# Patient Record
Sex: Female | Born: 1990 | Race: Black or African American | Hispanic: No | Marital: Single | Smoking: Never smoker
Health system: Southern US, Community
[De-identification: ages and names within clinical notes are randomized; demographics above are authoritative.]

---

## 2021-07-15 ENCOUNTER — Telehealth: Payer: Self-pay

## 2021-07-15 NOTE — Telephone Encounter (Signed)
TC to patient who has new OB appointment tomorrow with no records, no PT available. Patient states she has no PT record, but has U/S. Patient counseled to bring that with her to appointment. Patient also asked height, pre-pregnant weight and LMP. Information entered into chart.Burt Knack, RN

## 2021-07-15 NOTE — Progress Notes (Signed)
TC to patient who has new OB tomorrow. Height, pre-pregnant weight, LMP abstracted. Patient counseled to bring proof of pregnancy to appointment and she states she will bring U/S.Marland KitchenBurt Knack, RN

## 2021-07-16 ENCOUNTER — Ambulatory Visit: Payer: Medicaid Other | Admitting: Advanced Practice Midwife

## 2021-07-16 ENCOUNTER — Encounter: Payer: Self-pay | Admitting: Advanced Practice Midwife

## 2021-07-16 ENCOUNTER — Other Ambulatory Visit: Payer: Self-pay

## 2021-07-16 DIAGNOSIS — O0930 Supervision of pregnancy with insufficient antenatal care, unspecified trimester: Secondary | ICD-10-CM | POA: Insufficient documentation

## 2021-07-16 DIAGNOSIS — O99310 Alcohol use complicating pregnancy, unspecified trimester: Secondary | ICD-10-CM | POA: Insufficient documentation

## 2021-07-16 DIAGNOSIS — Z3482 Encounter for supervision of other normal pregnancy, second trimester: Secondary | ICD-10-CM | POA: Insufficient documentation

## 2021-07-16 DIAGNOSIS — O99312 Alcohol use complicating pregnancy, second trimester: Secondary | ICD-10-CM

## 2021-07-16 DIAGNOSIS — O0932 Supervision of pregnancy with insufficient antenatal care, second trimester: Secondary | ICD-10-CM

## 2021-07-16 DIAGNOSIS — Z98891 History of uterine scar from previous surgery: Secondary | ICD-10-CM | POA: Insufficient documentation

## 2021-07-16 LAB — WET PREP FOR TRICH, YEAST, CLUE
Trichomonas Exam: NEGATIVE
Yeast Exam: NEGATIVE

## 2021-07-16 LAB — HEMOGLOBIN, FINGERSTICK: Hemoglobin: 12.6 g/dL (ref 11.1–15.9)

## 2021-07-16 NOTE — Progress Notes (Signed)
In house labs reviewed, no treatment indicated. Patient declines flu vaccine today. ROI faxed to [facility] with confirmation (for 09/2020 pap test results).Burt Knack, RN

## 2021-07-16 NOTE — Progress Notes (Signed)
[facility] HEALTH DEPT [facility] [address] [place] [address] [phone]  INITIAL PRENATAL VISIT NOTE  Subjective:  Maureen Harris is a 30 y.o. SBF exsmoker G3P1011 at [redacted]w[redacted]d being seen today to start prenatal care at the [facility] Department.  She feels "happy and nervous" about planned pregnancy with no birth control x 3 years.  30 yo FOB feels "he's happy" about pregnancy; in supportive almost  3 year relationship and this is his first child.  She moved here 07/14/21 from [place], [place] and is not working; living with her mom, m. Aunt, 66 yo daughter.  Last cig 2014. Last MJ age 79. Last ETOH 05/29/21 (1 mixed drink) q weekend before pregnant.  She had 1 u/s on 06/23/21 at 10 5/7 and [facility] first ob apt at [facility] on 07/10/21. Last pap 09/2020 Jarold Motto, [place]. Declines Quad screen. Last dental exam 01/2021. LMP 04/09/21.  She is currently monitored for the following issues for this high-risk pregnancy and has Late prenatal care 14 wks; Low birth weight infant 5#7 at term 05/03/13; Prenatal care, subsequent pregnancy, second trimester; and Previous cesarean section 05/03/13 on their problem list.  Patient reports no complaints.  Contractions: Not present. Vag. Bleeding: None.  Movement: Absent. Denies leaking of fluid.   Indications for ASA therapy (per uptodate) One of the following: Previous pregnancy with preeclampsia, especially early onset and with an adverse outcome No Multifetal gestation No Chronic hypertension No Type 1 or 2 diabetes mellitus No Chronic kidney disease No Autoimmune disease (antiphospholipid syndrome, systemic lupus erythematosus) No  Two or more of the following: Nulliparity No Obesity (body mass index >30 kg/m2) No Family history of preeclampsia in mother or sister No Age ?35 years No Sociodemographic characteristics (African American race, low socioeconomic level) Yes Personal risk factors (eg,  previous pregnancy with low birth weight or small for gestational age infant, previous adverse pregnancy outcome [eg, stillbirth], interval >10 years between pregnancies) No   The following portions of the patient's history were reviewed and updated as appropriate: allergies, current medications, past family history, past medical history, past social history, past surgical history and problem list. Problem list updated.  Objective:   Vitals:   07/15/21 1725 07/16/21 1406  BP:  102/67  Pulse:  83  Temp:  97.8 F (36.6 C)  Weight:  130 lb 12.8 oz (59.3 kg)  Height: 5\' 4"  (1.626 m)     Fetal Status: Fetal Heart Rate (bpm): 160 Fundal Height: 14 cm Movement: Absent  Presentation: Undeterminable   Physical Exam Vitals and nursing note reviewed.  Constitutional:      General: She is not in acute distress.    Appearance: Normal appearance. She is well-developed and normal weight.  HENT:     Head: Normocephalic and atraumatic.     Right Ear: External ear normal.     Left Ear: External ear normal.     Nose: Nose normal. No congestion or rhinorrhea.     Mouth/Throat:     Lips: Pink.     Mouth: Mucous membranes are moist.     Dentition: Normal dentition. No dental caries.     Pharynx: Oropharynx is clear. Uvula midline.     Comments: Dentition: poor dentition with missing teeth and caries Last dental exam 01/2021 Eyes:     General: No scleral icterus.    Conjunctiva/sclera: Conjunctivae normal.  Neck:     Thyroid: No thyroid mass, thyromegaly or thyroid tenderness.  Cardiovascular:  Rate and Rhythm: Normal rate.     Pulses: Normal pulses.     Comments: Extremities are warm and well perfused Pulmonary:     Effort: Pulmonary effort is normal.     Breath sounds: Normal breath sounds.  Chest:     Chest wall: No mass.  Breasts:    Tanner Score is 5.     Breasts are symmetrical.     Right: Normal. No mass, nipple discharge or skin change.     Left: Normal. No mass, nipple  discharge or skin change.  Abdominal:     Palpations: Abdomen is soft.     Tenderness: There is no abdominal tenderness.     Comments: Gravid, soft without masses or tenderness, fundal height 14-16 wks, FHR=160  Genitourinary:    General: Normal vulva.     Exam position: Lithotomy position.     Pubic Area: No rash.      Labia:        Right: No rash.        Left: No rash.      Vagina: Vaginal discharge (white creamy leukorrhea, ph<4.5) present.     Cervix: Normal.     Uterus: Normal. Enlarged (Gravid 14-16 wks size). Not tender.      Rectum: Normal. No external hemorrhoid.  Musculoskeletal:     Right lower leg: No edema.     Left lower leg: No edema.  Lymphadenopathy:     Cervical: No cervical adenopathy.     Upper Body:     Right upper body: No axillary adenopathy.     Left upper body: No axillary adenopathy.  Skin:    General: Skin is warm.     Capillary Refill: Capillary refill takes less than 2 seconds.  Neurological:     Mental Status: She is alert.    Assessment and Plan:  Pregnancy: G3P1011 at [redacted]w[redacted]d  1. Late prenatal care 14 wks   2. Low birth weight infant 5#7 at term 05/03/13 monitor  3. Prenatal care, subsequent pregnancy, second trimester Declines genetic testing Counseled on 25-35 lb wt gain this pregnancy Anatomy u/s ordered at [facility] for 18 wks - CBC/D/Plt+RPR+Rh+ABO+Rub Ab... - Chlamydia/GC NAA, Confirmation - HCV Ab w Reflex to Quant PCR - Hgb Fractionation Cascade - HIV-1/HIV-2 Qualitative RNA - Urine Culture - [id number] Drug Screen - IGP, rfx Aptima HPV ASCU - WET PREP FOR TRICH, YEAST, CLUE - Hemoglobin, venipuncture - Urinalysis (Urine Dip)  4. Previous cesarean section 05/03/13 Desires repeat c/s    Discussed overview of care and coordination with inpatient delivery practices including [facility], [facility], [facility] and [facility].   Reviewed Centering pregnancy as standard of care at [facility]   Preterm labor symptoms and general obstetric  precautions including but not limited to vaginal bleeding, contractions, leaking of fluid and fetal movement were reviewed in detail with the patient.  Please refer to After Visit Summary for other counseling recommendations.   No follow-ups on file.  No future appointments.  Alberteen Spindle, CNM

## 2021-07-16 NOTE — Progress Notes (Signed)
Patient here for new OB visit at about 14 weeks. Moved recently from [place], [place], living with her mom, aunt, and 29 year old daughter. Working for [employer]. Had an U/S 06/23/2021, 10 weeks and 5 days by U/S, in [place]. U/S copied and sent for scanning.Burt Knack, RN

## 2021-07-17 LAB — CBC/D/PLT+RPR+RH+ABO+RUB AB...
Antibody Screen: NEGATIVE
Basophils Absolute: 0 10*3/uL (ref 0.0–0.2)
Basos: 0 %
EOS (ABSOLUTE): 0.2 10*3/uL (ref 0.0–0.4)
Eos: 3 %
Hematocrit: 38.7 % (ref 34.0–46.6)
Hemoglobin: 12.7 g/dL (ref 11.1–15.9)
Hepatitis B Surface Ag: NEGATIVE
Immature Grans (Abs): 0 10*3/uL (ref 0.0–0.1)
Immature Granulocytes: 0 %
Lymphocytes Absolute: 1.2 10*3/uL (ref 0.7–3.1)
Lymphs: 16 %
MCH: 29.5 pg (ref 26.6–33.0)
MCHC: 32.8 g/dL (ref 31.5–35.7)
MCV: 90 fL (ref 79–97)
Monocytes Absolute: 0.4 10*3/uL (ref 0.1–0.9)
Monocytes: 5 %
Neutrophils Absolute: 6.1 10*3/uL (ref 1.4–7.0)
Neutrophils: 76 %
Platelets: 274 10*3/uL (ref 150–450)
RBC: 4.3 x10E6/uL (ref 3.77–5.28)
RDW: 11.9 % (ref 11.7–15.4)
RPR Ser Ql: NONREACTIVE
Rh Factor: POSITIVE
Rubella Antibodies, IGG: 1.08 index (ref >0.99)
Varicella zoster IgG: 577 index (ref >165)
WBC: 8 10*3/uL (ref 3.4–10.8)

## 2021-07-17 LAB — URINALYSIS
Bilirubin, UA: NEGATIVE
Glucose, UA: NEGATIVE
Ketones, UA: NEGATIVE
Leukocytes,UA: NEGATIVE
Nitrite, UA: NEGATIVE
Protein,UA: NEGATIVE
Specific Gravity, UA: 1.025 (ref 1.005–1.030)
Urobilinogen, Ur: 2 mg/dL — ABNORMAL HIGH (ref 0.2–1.0)
pH, UA: 7 (ref 5.0–7.5)

## 2021-07-17 LAB — AVAILABLE
Amphetamines, Urine: NEGATIVE ng/mL
BENZODIAZ UR QL: NEGATIVE ng/mL
Barbiturate screen, urine: NEGATIVE ng/mL
Cannabinoid Quant, Ur: NEGATIVE ng/mL
Cocaine (Metab.): NEGATIVE ng/mL
OPIATE SCREEN URINE: NEGATIVE ng/mL
Oxycodone/Oxymorphone, Urine: NEGATIVE ng/mL
PCP Quant, Ur: NEGATIVE ng/mL

## 2021-07-17 LAB — HCV INTERPRETATION

## 2021-07-17 LAB — HCV AB W REFLEX TO QUANT PCR: HCV Ab: <0.1 {s_co_ratio} (ref 0.0–0.9)

## 2021-07-18 LAB — HGB FRACTIONATION BY HPLC
Hgb A: 90.6 % — ABNORMAL LOW (ref 96.4–98.8)
Hgb C: 0 %
Hgb E: 0 %
Hgb F: 0 % (ref 0.0–2.0)
Hgb S: 0 %
Hgb Variant: 7 % — ABNORMAL HIGH

## 2021-07-18 LAB — HGB FRACTIONATION CASCADE: Hgb A2: 2.4 % (ref 1.8–3.2)

## 2021-07-18 LAB — HIV-1/HIV-2 QUALITATIVE RNA
HIV-1 RNA, Qualitative: NONREACTIVE
HIV-2 RNA, Qualitative: NONREACTIVE

## 2021-07-19 LAB — IGP, RFX APTIMA HPV ASCU: PAP Smear Comment: 0

## 2021-07-19 LAB — URINE CULTURE

## 2021-07-19 LAB — CHLAMYDIA/GC NAA, CONFIRMATION
Chlamydia trachomatis, NAA: NEGATIVE
Neisseria gonorrhoeae, NAA: NEGATIVE

## 2021-07-21 ENCOUNTER — Telehealth: Payer: Self-pay

## 2021-07-21 ENCOUNTER — Encounter: Payer: Self-pay | Admitting: Advanced Practice Midwife

## 2021-07-21 DIAGNOSIS — R799 Abnormal finding of blood chemistry, unspecified: Secondary | ICD-10-CM | POA: Insufficient documentation

## 2021-07-21 NOTE — Telephone Encounter (Signed)
Call to client with [facility] anatomy US appt on 08/27/2021. Client to arrive at 1400 with a full bladder. Left message to call regarding [place] appt and number to call provided. Jossie Ng, RN

## 2021-07-21 NOTE — Telephone Encounter (Signed)
Client's return call was received in [facility] and client told [facility] RN would call her back. Call to client and given [place] appt with arrival time of 1400 with a full bladder. Jossie Ng, RN

## 2021-07-23 ENCOUNTER — Other Ambulatory Visit: Payer: Self-pay | Admitting: Advanced Practice Midwife

## 2021-07-23 DIAGNOSIS — R799 Abnormal finding of blood chemistry, unspecified: Secondary | ICD-10-CM

## 2021-08-01 ENCOUNTER — Inpatient Hospital Stay: Payer: Self-pay

## 2021-08-01 ENCOUNTER — Inpatient Hospital Stay: Payer: Self-pay | Admitting: Internal Medicine

## 2021-08-11 ENCOUNTER — Other Ambulatory Visit: Payer: Self-pay

## 2021-08-11 ENCOUNTER — Inpatient Hospital Stay: Payer: Medicaid Other | Attending: Internal Medicine | Admitting: Internal Medicine

## 2021-08-11 ENCOUNTER — Inpatient Hospital Stay: Payer: Medicaid Other

## 2021-08-11 ENCOUNTER — Telehealth: Payer: Self-pay

## 2021-08-11 ENCOUNTER — Encounter: Payer: Self-pay | Admitting: Internal Medicine

## 2021-08-11 DIAGNOSIS — D582 Other hemoglobinopathies: Secondary | ICD-10-CM | POA: Diagnosis not present

## 2021-08-11 DIAGNOSIS — O280 Abnormal hematological finding on antenatal screening of mother: Secondary | ICD-10-CM | POA: Insufficient documentation

## 2021-08-11 NOTE — Assessment & Plan Note (Addendum)
#  Question abnormal hemoglobin alpha variant-on screening.  Patient does not clinically symptomatic/patient does not have anemia or microcytosis.  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  #Prenatal counseling: Will discuss with referring provider.  I left a message for the referring provider.  Thank you Ms.Sciora for allowing me to participate in the care of your pleasant patient. Please do not hesitate to contact me with questions or concerns in the interim.    # DISPOSITION: # follow up as needed-Dr.B

## 2021-08-11 NOTE — Progress Notes (Signed)
Patient here for initial oncology appointment, expresses concerns of fatigue and  nausea  

## 2021-08-11 NOTE — Progress Notes (Signed)
[facility] CONSULT NOTE  Patient Care Team: Pcp, No as PCP - General  CHIEF COMPLAINTS/PURPOSE OF CONSULTATION: Abnormal hemoglobin electrophoresis.  Hgb F 0.0 - 2.0 % Reflexed  0.0   Hgb A 96.4 - 98.8 % Reflexed  90.6 Low    Hgb A2 1.8 - 3.2 % 2.4    Hgb S 0.0 % Reflexed  0.0   Interpretation, Hgb Fract  Comment    Comment: Capillary Electrophoresis (CE) performed. Reflex to High-pressure  liquid chromatography (HPLC) method for confirmation.     Final Interpretation:  Note:    Comment: Hemoglobin pattern reveals a small peak that may be consistent  with an alpha chain variant. We are unable to identify at this  time.      HISTORY OF PRESENTING ILLNESS:  Maureen Harris 30 y.o.  female  in second trimester pregnancy is here for follow-up for abnormal hemoglobin electrophoresis testing.  Patient has a normal hemoglobin.  However as part of screening-hemoglobin electrophoresis was abnormal as above.  This led to further evaluation with HPLC-question alpha chain variant.  Patient is expected date of delivery is on January 14, 2022.  Patient complains of mild nausea/fatigue as expected from pregnancy.  No swelling in the legs.  No vomiting.  Patient seems to be compliant with her prenatal vitamins.   Review of Systems  Constitutional:  Negative for chills, diaphoresis, fever, malaise/fatigue and weight loss.  HENT:  Negative for nosebleeds and sore throat.   Eyes:  Negative for double vision.  Respiratory:  Negative for cough, hemoptysis, sputum production, shortness of breath and wheezing.   Cardiovascular:  Negative for chest pain, palpitations, orthopnea and leg swelling.  Gastrointestinal:  Negative for abdominal pain, blood in stool, constipation, diarrhea, heartburn, melena, nausea and vomiting.  Genitourinary:  Negative for dysuria, frequency and urgency.  Musculoskeletal:  Negative for back pain and joint pain.  Skin: Negative.  Negative for itching and  rash.  Neurological:  Negative for dizziness, tingling, focal weakness, weakness and headaches.  Endo/Heme/Allergies:  Does not bruise/bleed easily.  Psychiatric/Behavioral:  Negative for depression. The patient is not nervous/anxious and does not have insomnia.     MEDICAL HISTORY:  History reviewed. No pertinent past medical history.  SURGICAL HISTORY: Past Surgical History:  Procedure Laterality Date   CESAREAN SECTION  2014    SOCIAL HISTORY: Social History   Socioeconomic History   Marital status: Single    Spouse name: Not on file   Number of children: 1   Years of education: 16+   Highest education level: Bachelor's degree (e.g., BA, AB, BS)  Occupational History    Comment: [employer]  Tobacco Use   Smoking status: Never    Passive exposure: Never   Smokeless tobacco: Never  Vaping Use   Vaping Use: Never used  Substance and Sexual Activity   Alcohol use: Yes    Comment: last alcohol 04/2021, before knowing about pregnancy   Drug use: Never   Sexual activity: Not Currently    Birth control/protection: None  Other Topics Concern   Not on file  Social History Narrative   Not on file   Social Determinants of Health   Financial Resource Strain: Low Risk    Difficulty of Paying Living Expenses: Not very hard  Food Insecurity: No Food Insecurity   Worried About Running Out of Food in the Last Year: Never true   Ran Out of Food in the Last Year: Never true  Transportation Needs: No Transportation  Needs   Lack of Transportation (Medical): No   Lack of Transportation (Non-Medical): No  Physical Activity: Not on file  Stress: Not on file  Social Connections: Not on file  Intimate Partner Violence: Not At Risk   Fear of Current or Ex-Partner: No   Emotionally Abused: No   Physically Abused: No   Sexually Abused: No    FAMILY HISTORY: Family History  Problem Relation Age of Onset   Hypertension Mother     ALLERGIES:  has No Known  Allergies.  MEDICATIONS:  Current Outpatient Medications  Medication Sig Dispense Refill   Prenatal Vit-Fe Fumarate-FA (MULTIVITAMIN-PRENATAL) 27-0.8 MG TABS tablet Take 1 tablet by mouth daily at 12 noon.     No current facility-administered medications for this visit.      Marland Kitchen  PHYSICAL EXAMINATION:  Vitals:   08/11/21 1414  BP: 102/67  Pulse: 87  Resp: 17  Temp: 98.5 F (36.9 C)  SpO2: 100%   Filed Weights   08/11/21 1414  Weight: 132 lb (59.9 kg)    Physical Exam Vitals and nursing note reviewed.  HENT:     Head: Normocephalic and atraumatic.     Mouth/Throat:     Pharynx: Oropharynx is clear.  Eyes:     Extraocular Movements: Extraocular movements intact.     Pupils: Pupils are equal, round, and reactive to light.  Cardiovascular:     Rate and Rhythm: Normal rate and regular rhythm.  Pulmonary:     Comments: Decreased breath sounds bilaterally.  Abdominal:     Palpations: Abdomen is soft.  Musculoskeletal:        General: Normal range of motion.     Cervical back: Normal range of motion.  Skin:    General: Skin is warm.  Neurological:     General: No focal deficit present.     Mental Status: She is alert and oriented to person, place, and time.  Psychiatric:        Behavior: Behavior normal.        Judgment: Judgment normal.     LABORATORY DATA:  I have reviewed the data as listed Lab Results  Component Value Date   WBC 8.0 07/16/2021   HGB 12.7 07/16/2021   HCT 38.7 07/16/2021   MCV 90 07/16/2021   PLT 274 07/16/2021   No results for input(s): NA, K, CL, CO2, GLUCOSE, BUN, CREATININE, CALCIUM, GFRNONAA, GFRAA, PROT, ALBUMIN, AST, ALT, ALKPHOS, BILITOT, BILIDIR, IBILI in the last 8760 hours.  RADIOGRAPHIC STUDIES: I have personally reviewed the radiological images as listed and agreed with the findings in the report. No results found.  Abnormal hematological finding on antenatal screening of mother, antepartum #Question abnormal hemoglobin  alpha variant-on screening.  Patient does not clinically symptomatic/patient does not have anemia or microcytosis.  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  #Prenatal counseling: Will discuss with referring provider.  I left a message for the referring provider.  Thank you Ms.Sciora for allowing me to participate in the care of your pleasant patient. Please do not hesitate to contact me with questions or concerns in the interim.    # DISPOSITION: # follow up as needed-Dr.B    All questions were answered. The patient knows to call the clinic with any problems, questions or concerns.       Earna Coder, MD 08/11/2021 3:47 PM

## 2021-08-11 NOTE — Telephone Encounter (Signed)
Opened in error

## 2021-08-13 ENCOUNTER — Ambulatory Visit: Payer: Medicaid Other | Admitting: Advanced Practice Midwife

## 2021-08-13 ENCOUNTER — Other Ambulatory Visit: Payer: Self-pay

## 2021-08-13 DIAGNOSIS — O0930 Supervision of pregnancy with insufficient antenatal care, unspecified trimester: Secondary | ICD-10-CM

## 2021-08-13 DIAGNOSIS — Z3482 Encounter for supervision of other normal pregnancy, second trimester: Secondary | ICD-10-CM

## 2021-08-13 DIAGNOSIS — R799 Abnormal finding of blood chemistry, unspecified: Secondary | ICD-10-CM

## 2021-08-13 NOTE — Progress Notes (Signed)
Kept [facility] hematology consult appt. Aware of [facility] [place] appt on 08/27/2021. Quad screen declined. Jossie Ng, RN

## 2021-08-13 NOTE — Progress Notes (Signed)
[facility] Health Department Maternal Health Clinic  PRENATAL VISIT NOTE  Subjective:  Maureen Harris is a 30 y.o. G3P1011 at [redacted]w[redacted]d being seen today for ongoing prenatal care.  She is currently monitored for the following issues for this low-risk pregnancy and has Late prenatal care 14 wks; Low birth weight infant 5#7 at term 05/03/13; Prenatal care, subsequent pregnancy, second trimester; Previous cesarean section 05/03/13; Alcohol use affecting pregnancy; last use 05/29/21; Abnormal blood finding 07/16/21; and Abnormal hematological finding on antenatal screening of mother, antepartum on their problem list.  Patient reports no complaints.  Contractions: Not present. Vag. Bleeding: None.  Movement: Present. Denies leaking of fluid/ROM.   The following portions of the patient's history were reviewed and updated as appropriate: allergies, current medications, past family history, past medical history, past social history, past surgical history and problem list. Problem list updated.  Objective:   Vitals:   08/13/21 1320  BP: 95/60  Pulse: 87  Temp: 98.8 F (37.1 C)  Weight: 133 lb 12.8 oz (60.7 kg)    Fetal Status: Fetal Heart Rate (bpm): 150 Fundal Height: 18 cm Movement: Present     General:  Alert, oriented and cooperative. Patient is in no acute distress.  Skin: Skin is warm and dry. No rash noted.   Cardiovascular: Normal heart rate noted  Respiratory: Normal respiratory effort, no problems with respiration noted  Abdomen: Soft, gravid, appropriate for gestational age.  Pain/Pressure: Absent     Pelvic: Cervical exam deferred        Extremities: Normal range of motion.  Edema: None  Mental Status: Normal mood and affect. Normal behavior. Normal judgment and thought content.   Assessment and Plan:  Pregnancy: G3P1011 at [redacted]w[redacted]d  1. 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 Aunt, 80 yo daughter U/s 08/27/21 Denies ETOH use since 05/29/21 (1 mixed drink) Declines Quad screen  2. Low birth weight infant 5#7 at term 05/03/13 Monitor fundal height and maternal weight gain  3. Late prenatal care 14 wks   4. 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 Please refer to After Visit Summary for other counseling recommendations.  Return in about 4 weeks (around 09/10/2021) for routine PNC.  Future Appointments  Date Time Provider Department Center  08/27/2021  2:30 PM [facility] OB ANATOMY ONLY [facility]    Alberteen Spindle, CNM

## 2021-08-27 ENCOUNTER — Ambulatory Visit
Admission: RE | Admit: 2021-08-27 | Discharge: 2021-08-27 | Disposition: A | Discharge status: Home / Self Care | Payer: Medicaid Other | Source: Ambulatory Visit | Attending: Advanced Practice Midwife | Admitting: Advanced Practice Midwife

## 2021-08-27 DIAGNOSIS — Z3482 Encounter for supervision of other normal pregnancy, second trimester: Secondary | ICD-10-CM

## 2021-08-28 ENCOUNTER — Encounter: Payer: Self-pay | Admitting: Advanced Practice Midwife

## 2021-08-28 DIAGNOSIS — O4440 Low lying placenta NOS or without hemorrhage, unspecified trimester: Secondary | ICD-10-CM | POA: Insufficient documentation

## 2021-09-10 ENCOUNTER — Other Ambulatory Visit: Payer: Self-pay

## 2021-09-10 ENCOUNTER — Encounter: Payer: Self-pay | Admitting: Physician Assistant

## 2021-09-10 ENCOUNTER — Ambulatory Visit: Payer: Medicaid Other | Admitting: Physician Assistant

## 2021-09-10 VITALS — BP 104/63 | HR 85 | Temp 98.3°F | Wt 140.0 lb

## 2021-09-10 DIAGNOSIS — O99310 Alcohol use complicating pregnancy, unspecified trimester: Secondary | ICD-10-CM

## 2021-09-10 DIAGNOSIS — Z3482 Encounter for supervision of other normal pregnancy, second trimester: Secondary | ICD-10-CM

## 2021-09-10 DIAGNOSIS — O99312 Alcohol use complicating pregnancy, second trimester: Secondary | ICD-10-CM

## 2021-09-10 NOTE — Progress Notes (Signed)
[facility] Health Department Maternal Health Clinic  PRENATAL VISIT NOTE  Subjective:  Maureen Harris is a 30 y.o. G3P1011 at [redacted]w[redacted]d being seen today for ongoing prenatal care.  She is currently monitored for the following issues for this low-risk pregnancy and has Late prenatal care 14 wks; Low birth weight infant 5#7 at term 05/03/13; Prenatal care, subsequent pregnancy, second trimester; Previous cesarean section 05/03/13; Alcohol use affecting pregnancy; last use 05/29/21; Abnormal blood finding 07/16/21; Abnormal hematological finding on antenatal screening of mother, antepartum; and Low-lying placenta 1.9 cm from internal os on 08/28/21 on their problem list.  Patient reports no complaints.  Contractions: Not present. Vag. Bleeding: None.  Movement: Present. Denies leaking of fluid/ROM.   The following portions of the patient's history were reviewed and updated as appropriate: allergies, current medications, past family history, past medical history, past social history, past surgical history and problem list. Problem list updated.  Objective:   Vitals:   09/10/21 1418  BP: 104/63  Pulse: 85  Temp: 98.3 F (36.8 C)  Weight: 140 lb (63.5 kg)    Fetal Status: Fetal Heart Rate (bpm): 140 Fundal Height: 23 cm Movement: Present     General:  Alert, oriented and cooperative. Patient is in no acute distress.  Skin: Skin is warm and dry. No rash noted.   Cardiovascular: Normal heart rate noted  Respiratory: Normal respiratory effort, no problems with respiration noted  Abdomen: Soft, gravid, appropriate for gestational age.  Pain/Pressure: Absent     Pelvic: Cervical exam deferred        Extremities: Normal range of motion.  Edema: None  Mental Status: Normal mood and affect. Normal behavior. Normal judgment and thought content.   Assessment and Plan:  Pregnancy: G3P1011 at [redacted]w[redacted]d  1. Prenatal care, subsequent pregnancy, second trimester - 30 year old in clinic today for routine  prenatal visit.   -Patient states she is taking her PNV daily. -Encouraged patient to continue to drink plenty of water and exercise frequently throughout the week. -Reviewed warning signs of preterm labor with patient.  Literature given by nurse.   -PHQ-9 completed, score 3  2. Maternal alcohol use complicating pregnancy, antepartum - Assessed alcohol use.  Patient declines use of any alcohol.  Last use was in September.     Term labor symptoms and general obstetric precautions including but not limited to vaginal bleeding, contractions, leaking of fluid and fetal movement were reviewed in detail with the patient. Please refer to After Visit Summary for other counseling recommendations.  Return in about 4 weeks (around 10/08/2021) for Routine prenatal care visit.  Future Appointments  Date Time Provider Department Center  10/08/2021  2:40 PM [facility] PROVIDER [facility] None    Glenna Fellows, FNP

## 2021-09-10 NOTE — Progress Notes (Signed)
Patient here for MH RV at 22 weeks. CMHRP and PHQ9 today. S/S PTL reviewed today, literature given. Patient desires repeat C-section.Burt Knack, RN

## 2021-10-08 ENCOUNTER — Ambulatory Visit: Payer: Medicaid Other | Admitting: Advanced Practice Midwife

## 2021-10-08 ENCOUNTER — Other Ambulatory Visit: Payer: Self-pay

## 2021-10-08 VITALS — BP 102/58 | HR 92 | Temp 98.7°F | Wt 143.6 lb

## 2021-10-08 DIAGNOSIS — Z98891 History of uterine scar from previous surgery: Secondary | ICD-10-CM

## 2021-10-08 DIAGNOSIS — O4440 Low lying placenta NOS or without hemorrhage, unspecified trimester: Secondary | ICD-10-CM

## 2021-10-08 DIAGNOSIS — Z3482 Encounter for supervision of other normal pregnancy, second trimester: Secondary | ICD-10-CM

## 2021-10-08 DIAGNOSIS — O99310 Alcohol use complicating pregnancy, unspecified trimester: Secondary | ICD-10-CM

## 2021-10-08 NOTE — Progress Notes (Signed)
[place] [facility]  PRENATAL VISIT NOTE  Subjective:  Maureen Harris is a 31 y.o. G3P1011 at [redacted]w[redacted]d being seen today for ongoing prenatal care.  She is currently monitored for the following issues for this low-risk pregnancy and has Late prenatal care 14 wks; Low birth weight infant 5#7 at term 05/03/13; Prenatal care, subsequent pregnancy, second trimester; Previous cesarean section 05/03/13; Alcohol use affecting pregnancy; last use 05/29/21; Abnormal blood finding 07/16/21; Abnormal hematological finding on antenatal screening of mother, antepartum; and Low-lying placenta 1.9 cm from internal os on 08/28/21 on their problem list.  Patient reports no complaints.   .  .  Movement: Present. Denies leaking of fluid/ROM.   The following portions of the patient's history were reviewed and updated as appropriate: allergies, current medications, past family history, past medical history, past social history, past surgical history and problem list. Problem list updated.  Objective:   Vitals:   10/08/21 1436  BP: (!) 102/58  Pulse: 92  Temp: 98.7 F (37.1 C)  Weight: 143 lb 9.6 oz (65.1 kg)    Fetal Status:   Fundal Height: 27 cm Movement: Present     General:  Alert, oriented and cooperative. Patient is in no acute distress.  Skin: Skin is warm and dry. No rash noted.   Cardiovascular: Normal heart rate noted  Respiratory: Normal respiratory effort, no problems with respiration noted  Abdomen: Soft, gravid, appropriate for gestational age.  Pain/Pressure: Absent     Pelvic: Cervical exam deferred        Extremities: Normal range of motion.  Edema: None  Mental Status: Normal mood and affect. Normal behavior. Normal judgment and thought content.   Assessment and Plan:  Pregnancy: G3P1011 at [redacted]w[redacted]d  1. Prenatal care, subsequent pregnancy, second trimester Working 51 hrs/wk; living with her mom and 53 yo daughter Walking 1x/wk x 15 min to store 13 lb 9.6 oz  (6.169 kg) Reviewed 08/27/21 u/s at 20 2/7 with posterior placenta, AFI wnl, normal anatomy, low lying placenta 1.9 cm from internal os  2. Previous cesarean section 05/03/13 Desires repeat c/s Will need delivery plans apt with [place] 32-34 wks  3. Low-lying placenta 1.9 cm from internal os on 08/28/21 Needs f/u u/s at 30 wks--ordered today  4. Maternal alcohol use complicating pregnancy, antepartum Denies use since 05/2021    Preterm labor symptoms and general obstetric precautions including but not limited to vaginal bleeding, contractions, leaking of fluid and fetal movement were reviewed in detail with the patient. Please refer to After Visit Summary for other counseling recommendations.  Return in about 2 weeks (around 10/22/2021) for 28 week labs, routine PNC.  No future appointments.  Herbie Saxon, CNM

## 2021-10-15 ENCOUNTER — Telehealth: Payer: Self-pay

## 2021-10-15 NOTE — Telephone Encounter (Signed)
Call to Anson Fret at [facility] to ascertain if [place] (for previa follow-up from 08/28/2021) has been scheduled. Per Anson Fret, [place] appt scheduled for 1330 with arrival time of 1315 and client has delivery plans appt with Dr. Jean Rosenthal following [place] appt. Per Anson Fret, client aware of appt as scheduled. Jossie Ng, RN

## 2021-10-22 ENCOUNTER — Ambulatory Visit: Payer: Medicaid Other | Admitting: Nurse Practitioner

## 2021-10-22 ENCOUNTER — Encounter: Payer: Self-pay | Admitting: Nurse Practitioner

## 2021-10-22 ENCOUNTER — Other Ambulatory Visit: Payer: Self-pay

## 2021-10-22 VITALS — BP 104/67 | HR 86 | Temp 97.0°F | Wt 146.6 lb

## 2021-10-22 DIAGNOSIS — O4440 Low lying placenta NOS or without hemorrhage, unspecified trimester: Secondary | ICD-10-CM

## 2021-10-22 DIAGNOSIS — O99310 Alcohol use complicating pregnancy, unspecified trimester: Secondary | ICD-10-CM

## 2021-10-22 DIAGNOSIS — O99013 Anemia complicating pregnancy, third trimester: Secondary | ICD-10-CM

## 2021-10-22 DIAGNOSIS — Z3483 Encounter for supervision of other normal pregnancy, third trimester: Secondary | ICD-10-CM

## 2021-10-22 DIAGNOSIS — Z23 Encounter for immunization: Secondary | ICD-10-CM

## 2021-10-22 LAB — HEMOGLOBIN, FINGERSTICK: Hemoglobin: 10.4 g/dL — ABNORMAL LOW (ref 11.1–15.9)

## 2021-10-22 MED ORDER — IRON (FERROUS SULFATE) 325 (65 FE) MG PO TABS: 1.0000 | ORAL_TABLET | Freq: Every day | ORAL | 0 refills | Status: AC

## 2021-10-22 NOTE — Progress Notes (Signed)
[facility]  PRENATAL VISIT NOTE  Subjective:  Maureen Harris is a 31 y.o. G3P1011 at [redacted]w[redacted]d being seen today for ongoing prenatal care.  She is currently monitored for the following issues for this low-risk pregnancy and has Late prenatal care 14 wks; Low birth weight infant 5#7 at term 05/03/13; Prenatal care, subsequent pregnancy, second trimester; Previous cesarean section 05/03/13; Alcohol use affecting pregnancy; last use 05/29/21; Abnormal blood finding 07/16/21; Abnormal hematological finding on antenatal screening of mother, antepartum; Low-lying placenta 1.9 cm from internal os on 08/28/21; and Anemia affecting pregnancy in third trimester on their problem list.  Patient reports  soreness to upper legs .  Contractions: Not present. Vag. Bleeding: None.  Movement: Present. Denies leaking of fluid/ROM.   The following portions of the patient's history were reviewed and updated as appropriate: allergies, current medications, past family history, past medical history, past social history, past surgical history and problem list. Problem list updated.  Objective:   Vitals:   10/22/21 1414  BP: 104/67  Pulse: 86  Temp: (!) 97 F (36.1 C)  Weight: 146 lb 9.6 oz (66.5 kg)    Fetal Status: Fetal Heart Rate (bpm): 145 Fundal Height: 29 cm Movement: Present     General:  Alert, oriented and cooperative. Patient is in no acute distress.  Skin: Skin is warm and dry. No rash noted.   Cardiovascular: Normal heart rate noted  Respiratory: Normal respiratory effort, no problems with respiration noted  Abdomen: Soft, gravid, appropriate for gestational age.  Pain/Pressure: Absent     Pelvic: Cervical exam deferred        Extremities: Normal range of motion.  Edema: None  Mental Status: Normal mood and affect. Normal behavior. Normal judgment and thought content.   Assessment and Plan:  Pregnancy: G3P1011 at [redacted]w[redacted]d  1. Prenatal care, subsequent pregnancy,  third trimester -31 year old female seen for routine prenatal care -Patient states she is taking her PNV daily. -Patient to take Tylenol as needed for soreness to lower extremities.  Denies calf pain, redness, or swelling.    -Total weight gain since last visit is 3lbs.  -Patient continues to exercise.  -28 week labs completed today.   - Glucose, 1 hour gestational - HIV-1/HIV-2 Qualitative RNA - RPR - Hemoglobin, venipuncture  2. Low-lying placenta 1.9 cm from internal os on 08/28/21 -Patient to have follow up U/S at 30 wks.  U/S already ordered from last visit.   3. Maternal alcohol use complicating pregnancy, antepartum -Patient denies current alcohol use. Last drink 05/2021.    Term labor symptoms and general obstetric precautions including but not limited to vaginal bleeding, contractions, leaking of fluid and fetal movement were reviewed in detail with the patient. Please refer to After Visit Summary for other counseling recommendations.   Return in about 2 weeks (around 11/05/2021) for Routine prenatal care visit.  Future Appointments  Date Time Provider Department Center  11/04/2021  3:00 PM [facility] PROVIDER [facility] None    Glenna Fellows, FNP

## 2021-10-22 NOTE — Progress Notes (Signed)
Aware of [facility] [place] appt 11/07/2021 at 1315 with delivery plans appt with Dr. Jean Rosenthal after [place]. Counseled regarding Tdap recommendation in pregnancy and for those who will spend any amount of time around infant. Tolerated Tdap today without complaint. Jossie Ng, RN Hgb = 10.4 and iron initiated per standing order. Anemia profile added to current labs. Jossie Ng, RN

## 2021-10-22 NOTE — Progress Notes (Deleted)
[id number]

## 2021-10-23 LAB — FE+CBC/D/PLT+TIBC+FER+RETIC
Basophils Absolute: 0 10*3/uL (ref 0.0–0.2)
Basos: 0 %
EOS (ABSOLUTE): 0.1 10*3/uL (ref 0.0–0.4)
Eos: 2 %
Ferritin: 7 ng/mL — ABNORMAL LOW (ref 15–150)
Hematocrit: 30.2 % — ABNORMAL LOW (ref 34.0–46.6)
Hemoglobin: 10.2 g/dL — ABNORMAL LOW (ref 11.1–15.9)
Immature Grans (Abs): 0 10*3/uL (ref 0.0–0.1)
Immature Granulocytes: 0 %
Iron Saturation: 9 % — CL (ref 15–55)
Iron: 36 ug/dL (ref 27–159)
Lymphocytes Absolute: 0.9 10*3/uL (ref 0.7–3.1)
Lymphs: 17 %
MCH: 29.7 pg (ref 26.6–33.0)
MCHC: 33.8 g/dL (ref 31.5–35.7)
MCV: 88 fL (ref 79–97)
Monocytes Absolute: 0.4 10*3/uL (ref 0.1–0.9)
Monocytes: 8 %
Neutrophils Absolute: 4 10*3/uL (ref 1.4–7.0)
Neutrophils: 73 %
Platelets: 249 10*3/uL (ref 150–450)
RBC: 3.44 x10E6/uL — ABNORMAL LOW (ref 3.77–5.28)
RDW: 11.1 % — ABNORMAL LOW (ref 11.7–15.4)
Retic Ct Pct: 1.2 % (ref 0.6–2.6)
Total Iron Binding Capacity: 414 ug/dL (ref 250–450)
UIBC: 378 ug/dL (ref 131–425)
WBC: 5.5 10*3/uL (ref 3.4–10.8)

## 2021-10-23 LAB — RPR: RPR Ser Ql: NONREACTIVE

## 2021-10-23 LAB — HIV-1/HIV-2 QUALITATIVE RNA
HIV-1 RNA, Qualitative: NONREACTIVE
HIV-2 RNA, Qualitative: NONREACTIVE

## 2021-10-23 LAB — GLUCOSE, 1 HOUR GESTATIONAL: Gestational Diabetes Screen: 109 mg/dL (ref 70–139)

## 2021-11-04 ENCOUNTER — Ambulatory Visit: Payer: Medicaid Other | Admitting: Advanced Practice Midwife

## 2021-11-04 ENCOUNTER — Other Ambulatory Visit: Payer: Self-pay

## 2021-11-04 VITALS — BP 101/62 | HR 70 | Temp 98.6°F | Wt 147.4 lb

## 2021-11-04 DIAGNOSIS — O99310 Alcohol use complicating pregnancy, unspecified trimester: Secondary | ICD-10-CM

## 2021-11-04 DIAGNOSIS — Z98891 History of uterine scar from previous surgery: Secondary | ICD-10-CM

## 2021-11-04 DIAGNOSIS — O0930 Supervision of pregnancy with insufficient antenatal care, unspecified trimester: Secondary | ICD-10-CM

## 2021-11-04 DIAGNOSIS — O99013 Anemia complicating pregnancy, third trimester: Secondary | ICD-10-CM

## 2021-11-04 DIAGNOSIS — Z3482 Encounter for supervision of other normal pregnancy, second trimester: Secondary | ICD-10-CM

## 2021-11-04 DIAGNOSIS — O4440 Low lying placenta NOS or without hemorrhage, unspecified trimester: Secondary | ICD-10-CM

## 2021-11-04 NOTE — Progress Notes (Signed)
Remains undecided as to pediatrician and BCM post=partum. Denies the need for additional information at this time. Correctly verbalizes how to take iron and PNV. Taking iron with orange juice. Jossie Ng, RN

## 2021-11-04 NOTE — Progress Notes (Signed)
[place] [facility]  PRENATAL VISIT NOTE  Subjective:  Maureen Harris is a 31 y.o. G3P1011 at [redacted]w[redacted]d being seen today for ongoing prenatal care.  She is currently monitored for the following issues for this high-risk pregnancy and has Late prenatal care 14 wks; Low birth weight infant 5#7 at term 05/03/13; Prenatal care, subsequent pregnancy, second trimester; Previous cesarean section 05/03/13; Alcohol use affecting pregnancy; last use 05/29/21; Abnormal blood finding 07/16/21; Abnormal hematological finding on antenatal screening of mother, antepartum; Low-lying placenta 1.9 cm from internal os on 08/28/21; and Anemia affecting pregnancy in third trimester on their problem list.  Patient reports no complaints.  Contractions: Not present. Vag. Bleeding: None.  Movement: Present. Denies leaking of fluid/ROM.   The following portions of the patient's history were reviewed and updated as appropriate: allergies, current medications, past family history, past medical history, past social history, past surgical history and problem list. Problem list updated.  Objective:   Vitals:   11/04/21 1518  BP: 101/62  Pulse: 70  Temp: 98.6 F (37 C)  Weight: 147 lb 6.4 oz (66.9 kg)    Fetal Status: Fetal Heart Rate (bpm): 150 Fundal Height: 30 cm Movement: Present     General:  Alert, oriented and cooperative. Patient is in no acute distress.  Skin: Skin is warm and dry. No rash noted.   Cardiovascular: Normal heart rate noted  Respiratory: Normal respiratory effort, no problems with respiration noted  Abdomen: Soft, gravid, appropriate for gestational age.  Pain/Pressure: Absent     Pelvic: Cervical exam deferred        Extremities: Normal range of motion.  Edema: None  Mental Status: Normal mood and affect. Normal behavior. Normal judgment and thought content.   Assessment and Plan:  Pregnancy: G3P1011 at [redacted]w[redacted]d  1. Previous cesarean section 05/03/13 [facility] delivery  plans apt 11/07/21 with Dr. Glennon Mac  2. Low-lying placenta 1.9 cm from internal os on 08/28/21 F/u u/s 11/07/21  3. Anemia affecting pregnancy in third trimester Taking FeSo4 I daily with oj seperately from vits  4. Maternal alcohol use complicating pregnancy, antepartum States last use 05/2021  5. 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 Late prenatal care 14 wks     Preterm labor symptoms and general obstetric precautions including but not limited to vaginal bleeding, contractions, leaking of fluid and fetal movement were reviewed in detail with the patient. Please refer to After Visit Summary for other counseling recommendations.  No follow-ups on file.  No future appointments.  Herbie Saxon, CNM

## 2021-11-07 ENCOUNTER — Telehealth: Payer: Self-pay

## 2021-11-07 NOTE — Telephone Encounter (Signed)
Call received from Maximino Sarin, [facility] OB US sonographer stating client has appt today at 1330 and she does not have referral. Arnetha Courser CNM completed another referral form and it was faxed with prior [facility] [place] report. Fax confirmation received. Jossie Ng, RN

## 2021-11-18 ENCOUNTER — Ambulatory Visit: Payer: Medicaid Other

## 2021-11-18 ENCOUNTER — Telehealth: Payer: Self-pay

## 2021-11-18 NOTE — Telephone Encounter (Signed)
Attempt to contact patient regarding missed MH RV on 11/18/21.  No answer and LVM to return call at [phone] to get rescheduled.   Floy Sabina

## 2021-11-20 NOTE — Telephone Encounter (Signed)
Per [facility] Epic appt desk, client has [facility] RV scheduled for 11/24/2021. Jossie Ng, RN

## 2021-11-20 NOTE — Telephone Encounter (Signed)
Telephone call to patient this morning regarding her missed MHRV.  Voicemail was full, no message left.  Hart Carwin, RN

## 2021-11-24 ENCOUNTER — Other Ambulatory Visit: Payer: Self-pay

## 2021-11-24 ENCOUNTER — Ambulatory Visit: Payer: Medicaid Other | Admitting: Advanced Practice Midwife

## 2021-11-24 VITALS — BP 107/58 | HR 78 | Temp 98.3°F | Wt 155.0 lb

## 2021-11-24 DIAGNOSIS — Z3482 Encounter for supervision of other normal pregnancy, second trimester: Secondary | ICD-10-CM

## 2021-11-24 DIAGNOSIS — O99013 Anemia complicating pregnancy, third trimester: Secondary | ICD-10-CM

## 2021-11-24 DIAGNOSIS — Z3483 Encounter for supervision of other normal pregnancy, third trimester: Secondary | ICD-10-CM

## 2021-11-24 NOTE — Progress Notes (Signed)
Correctly verbalizes how to take iron with prenatal vitamin. Taking iron with orange juice. Aware of repeat C-section 01/07/2022. Appt reminder card given for Covid test 01/05/2022 at 0815 and 12/29/2021 0900 labs - [facility] (Pre-admit Testing). Jossie Ng, RN Client left without being seen by provider. Hgb order was cancelled. Appt desk indicates client scheduled [facility] RV appt for 12/01/2021. Jossie Ng, RN

## 2021-11-24 NOTE — Progress Notes (Signed)
Pt came 1 hour 40 min late for apt and provider was seeing pts that came to their apts on time. Pt left without being seen

## 2021-12-01 ENCOUNTER — Other Ambulatory Visit: Payer: Self-pay

## 2021-12-01 ENCOUNTER — Ambulatory Visit: Payer: Medicaid Other | Admitting: Advanced Practice Midwife

## 2021-12-01 VITALS — BP 114/73 | HR 92 | Temp 98.3°F | Wt 154.0 lb

## 2021-12-01 DIAGNOSIS — O99013 Anemia complicating pregnancy, third trimester: Secondary | ICD-10-CM

## 2021-12-01 DIAGNOSIS — Z3483 Encounter for supervision of other normal pregnancy, third trimester: Secondary | ICD-10-CM

## 2021-12-01 DIAGNOSIS — O0933 Supervision of pregnancy with insufficient antenatal care, third trimester: Secondary | ICD-10-CM

## 2021-12-01 DIAGNOSIS — O4440 Low lying placenta NOS or without hemorrhage, unspecified trimester: Secondary | ICD-10-CM

## 2021-12-01 DIAGNOSIS — O0930 Supervision of pregnancy with insufficient antenatal care, unspecified trimester: Secondary | ICD-10-CM

## 2021-12-01 DIAGNOSIS — Z3482 Encounter for supervision of other normal pregnancy, second trimester: Secondary | ICD-10-CM

## 2021-12-01 DIAGNOSIS — Z98891 History of uterine scar from previous surgery: Secondary | ICD-10-CM

## 2021-12-01 LAB — HEMOGLOBIN, FINGERSTICK: Hemoglobin: 10.8 g/dL — ABNORMAL LOW (ref 11.1–15.9)

## 2021-12-01 NOTE — Progress Notes (Signed)
Patient here for MH RV at 33 5/7. Has questions about maternity leave. Needs Hgb check today.Burt Knack, RN  ?

## 2021-12-01 NOTE — Progress Notes (Signed)
Hgb recheck 10.8 today. Patient counseled to continue taking iron once daily with vitamin C rich juice.Burt Knack, RN  ?

## 2021-12-01 NOTE — Progress Notes (Signed)
[facility] Department ?[facility] Health Clinic ? ?PRENATAL VISIT NOTE ? ?Subjective:  ?Maureen Harris is a 31 y.o. G3P1011 at [redacted]w[redacted]d being seen today for ongoing prenatal care.  She is currently monitored for the following issues for this low-risk pregnancy and has Late prenatal care 14 wks; Low birth weight infant 5#7 at term 05/03/13; Prenatal care, subsequent pregnancy, second trimester; Previous cesarean section 05/03/13; Alcohol use affecting pregnancy; last use 05/29/21; Abnormal blood finding 07/16/21; Abnormal hematological finding on antenatal screening of mother, antepartum; Low-lying placenta 1.9 cm from internal os on 08/28/21; and Anemia affecting pregnancy in third trimester on their problem list. ? ?Patient reports no complaints.  Contractions: Not present. Vag. Bleeding: None.  Movement: Present. Denies leaking of fluid/ROM.  ? ?The following portions of the patient's history were reviewed and updated as appropriate: allergies, current medications, past family history, past medical history, past social history, past surgical history and problem list. Problem list updated. ? ?Objective:  ? ?Vitals:  ? 12/01/21 1510  ?BP: 114/73  ?Pulse: 92  ?Temp: 98.3 ?F (36.8 ?C)  ?Weight: 154 lb (69.9 kg)  ? ? ?Fetal Status: Fetal Heart Rate (bpm): 140 Fundal Height: 34 cm Movement: Present    ? ?General:  Alert, oriented and cooperative. Patient is in no acute distress.  ?Skin: Skin is warm and dry. No rash noted.   ?Cardiovascular: Normal heart rate noted  ?Respiratory: Normal respiratory effort, no problems with respiration noted  ?Abdomen: Soft, gravid, appropriate for gestational age.  Pain/Pressure: Absent     ?Pelvic: Cervical exam deferred        ?Extremities: Normal range of motion.  Edema: None  ?Mental Status: Normal mood and affect. Normal behavior. Normal judgment and thought content.  ? ?Assessment and Plan:  ?Pregnancy: G3P1011 at [redacted]w[redacted]d ? ?1. Prenatal care, subsequent pregnancy, second  trimester ?24 lb (10.9 kg) ?Working 40 hrs/wk ?Living with her mom, 58 yo daughter, FOB ?Walking 2x/wk x 30 min ?Denies any physical/mental abuse from FOB and feels safe with him ?- Hemoglobin, fingerstick ? ?2. Anemia affecting pregnancy in third trimester ?Taking I FeSo4 daily with oj ? ?3. Previous cesarean section 05/03/13 ?Pt desires repeat c/s and scheduled for 01/07/22 ?Had delivery plans apt 11/07/21 with Dr. Jean Rosenthal ? ?4. Low-lying placenta 1.9 cm from internal os on 08/28/21 ?Per 11/07/21 u/s, placenta is now 7 cm from internal os ? ?5. Late prenatal care 14 wks ? ? ?- Hemoglobin, fingerstick ? ? ?Preterm labor symptoms and general obstetric precautions including but not limited to vaginal bleeding, contractions, leaking of fluid and fetal movement were reviewed in detail with the patient. ?Please refer to After Visit Summary for other counseling recommendations.  ?No follow-ups on file. ? ?Future Appointments  ?Date Time Provider Department Center  ?12/29/2021  9:00 AM [facility] PAT[facility] None  ?01/05/2022  8:15 AM [facility] None  ? ? ?Alberteen Spindle, CNM ? ?

## 2021-12-15 ENCOUNTER — Ambulatory Visit: Payer: Medicaid Other

## 2021-12-16 ENCOUNTER — Other Ambulatory Visit: Payer: Self-pay

## 2021-12-16 ENCOUNTER — Ambulatory Visit: Payer: Medicaid Other | Admitting: Advanced Practice Midwife

## 2021-12-16 VITALS — BP 123/75 | HR 83 | Temp 98.1°F | Wt 158.6 lb

## 2021-12-16 DIAGNOSIS — O99310 Alcohol use complicating pregnancy, unspecified trimester: Secondary | ICD-10-CM

## 2021-12-16 DIAGNOSIS — Z3482 Encounter for supervision of other normal pregnancy, second trimester: Secondary | ICD-10-CM

## 2021-12-16 DIAGNOSIS — Z98891 History of uterine scar from previous surgery: Secondary | ICD-10-CM

## 2021-12-16 DIAGNOSIS — O0930 Supervision of pregnancy with insufficient antenatal care, unspecified trimester: Secondary | ICD-10-CM

## 2021-12-16 DIAGNOSIS — O99013 Anemia complicating pregnancy, third trimester: Secondary | ICD-10-CM

## 2021-12-16 LAB — URINALYSIS
Bilirubin, UA: NEGATIVE
Glucose, UA: NEGATIVE
Ketones, UA: NEGATIVE
Nitrite, UA: NEGATIVE
Protein,UA: NEGATIVE
Specific Gravity, UA: 1.015 (ref 1.005–1.030)
Urobilinogen, Ur: 4 mg/dL — ABNORMAL HIGH (ref 0.2–1.0)
pH, UA: 7 (ref 5.0–7.5)

## 2021-12-16 NOTE — Progress Notes (Signed)
Correctly verbalizes how to take iron and prenatal vitamin. Taking iron with orange juice. Aware of 12/29/2021 preadmit testing appt for labs at 0900, Covid test appt 01/05/22 at 0815 and C-section scheduled for 4/12. Rich Number, RN ?States has selected [facility] for pediatric care and client encouraged to call to let them know of her decision / due date. Verbalized interest today in a BTL for pp BCM. Counseled regarding Medicaid requirement for BTL consent to be signed at least 30 days prior to delivery. Counseled that signing consent did not mean that she had to have procedure. Client declined to sign consent today. Rich Number, RN ?Urine dip reviewed by E. Sciora CNM. Rich Number, RN ? ? ? ?

## 2021-12-16 NOTE — Progress Notes (Signed)
[facility] Department ?Maternal Health Clinic ? ?PRENATAL VISIT NOTE ? ?Subjective:  ?Maureen Harris is a 31 y.o. G3P1011 at [redacted]w[redacted]d being seen today for ongoing prenatal care.  She is currently monitored for the following issues for this low-risk pregnancy and has Late prenatal care 14 wks; Low birth weight infant 5#7 at term 05/03/13; Prenatal care, subsequent pregnancy, second trimester; Previous cesarean section 05/03/13; Alcohol use affecting pregnancy; last use 05/29/21; Abnormal blood finding 07/16/21; Abnormal hematological finding on antenatal screening of mother, antepartum; Low-lying placenta 1.9 cm from internal os on 08/28/21; and Anemia affecting pregnancy in third trimester on their problem list. ? ?Patient reports  edema .  Contractions: Not present. Vag. Bleeding: None.  Movement: Present. Denies leaking of fluid/ROM.  ? ?The following portions of the patient's history were reviewed and updated as appropriate: allergies, current medications, past family history, past medical history, past social history, past surgical history and problem list. Problem list updated. ? ?Objective:  ? ?Vitals:  ? 12/16/21 1513  ?BP: 123/75  ?Pulse: 83  ?Temp: 98.1 ?F (36.7 ?C)  ?Weight: 158 lb 9.6 oz (71.9 kg)  ? ? ?Fetal Status: Fetal Heart Rate (bpm): 130 Fundal Height: 38 cm Movement: Present  Presentation: Vertex ? ?General:  Alert, oriented and cooperative. Patient is in no acute distress.  ?Skin: Skin is warm and dry. No rash noted.   ?Cardiovascular: Normal heart rate noted  ?Respiratory: Normal respiratory effort, no problems with respiration noted  ?Abdomen: Soft, gravid, appropriate for gestational age.  Pain/Pressure: Absent     ?Pelvic: Cervical exam deferred        ?Extremities: Normal range of motion.  Edema: Mild pitting, slight indentation  ?Mental Status: Normal mood and affect. Normal behavior. Normal judgment and thought content.  ? ?Assessment and Plan:  ?Pregnancy: G3P1011 at [redacted]w[redacted]d ? ?1. Anemia  affecting pregnancy in third trimester ?Taking FeSo4 I daily with oj ? ?2. Previous cesarean section 05/03/13 ?Repeat c/s scheduled for 01/07/22 ? ?3. Prenatal care, subsequent pregnancy, second trimester ?28 lb 9.6 oz (13 kg) ?Had baby shower 01/13/22 and has 2 car seats and ready for baby at home ?Working 40 hrs/wk at [employer] in [place] but hours were changed to also work weekends 10 hour days; requesting note for work--written to work no more than 8 hours/day and no more than 40 hrs/wk ?C/o edema LE: dinner last night: rice, 2 sausage links with tomato sauce, water. Counseled to avoid salty foods, increase water, elevate legs ?Neg proteinuria, 123/75 ?Walking 1-2x/wk x 20 min ?- Urinalysis (Urine Dip) ?- [id number] Drug Screen ? ?4. Maternal alcohol use complicating pregnancy, antepartum ?States last use 05/2021 1 mixed drink ?Agrees to UDS today and denies drug use ? ?5. Late prenatal care 14 wks ? ? ? ?Preterm labor symptoms and general obstetric precautions including but not limited to vaginal bleeding, contractions, leaking of fluid and fetal movement were reviewed in detail with the patient. ?Please refer to After Visit Summary for other counseling recommendations.  ?Return in about 1 week (around 12/23/2021) for routine PNC. ? ?Future Appointments  ?Date Time Provider Department Center  ?12/29/2021  9:00 AM [facility] None  ?01/05/2022  8:15 AM [facility] PAT[facility] None  ? ? ?Alberteen Spindle, CNM ? ?

## 2021-12-16 NOTE — Progress Notes (Signed)
? ?Established Patient Office Visit ? ?Subjective:  ?Patient ID: Maureen Harris, female    DOB: [birth year]  Age: 31 y.o. MRN: [id number] ? ?CC:  ?Chief Complaint  ?Patient presents with  ? Routine Prenatal Visit  ?  EGA = 35 6/7  ? ? ?HPI ?Maureen Harris presents for  ? ?Past Medical History:  ?Diagnosis Date  ? Anemia affecting pregnancy in third trimester 10/22/2021  ? ? ?Past Surgical History:  ?Procedure Laterality Date  ? CESAREAN SECTION  2014  ? ? ?Family History  ?Problem Relation Age of Onset  ? Hypertension Mother   ? ? ?Social History  ? ?Socioeconomic History  ? Marital status: Single  ?  Spouse name: Not on file  ? Number of children: 1  ? Years of education: 16+  ? Highest education level: Bachelor's degree (e.g., BA, AB, BS)  ?Occupational History  ?  Comment: [employer]  ?Tobacco Use  ? Smoking status: Never  ?  Passive exposure: Never  ? Smokeless tobacco: Never  ?Vaping Use  ? Vaping Use: Never used  ?Substance and Sexual Activity  ? Alcohol use: Yes  ?  Comment: last alcohol 04/2021, before knowing about pregnancy  ? Drug use: Never  ? Sexual activity: Not Currently  ?  Birth control/protection: None  ?Other Topics Concern  ? Not on file  ?Social History Narrative  ? Not on file  ? ?Social Determinants of Health  ? ?Financial Resource Strain: Low Risk   ? Difficulty of Paying Living Expenses: Not very hard  ?Food Insecurity: No Food Insecurity  ? Worried About Charity fundraiser in the Last Year: Never true  ? Ran Out of Food in the Last Year: Never true  ?Transportation Needs: No Transportation Needs  ? Lack of Transportation (Medical): No  ? Lack of Transportation (Non-Medical): No  ?Physical Activity: Not on file  ?Stress: Not on file  ?Social Connections: Not on file  ?Intimate Partner Violence: Not At Risk  ? Fear of Current or Ex-Partner: No  ? Emotionally Abused: No  ? Physically Abused: No  ? Sexually Abused: No  ? ? ?Outpatient Medications Prior to Visit  ?Medication Sig Dispense  Refill  ? Iron, Ferrous Sulfate, 325 (65 Fe) MG TABS Take 1 tablet by mouth daily at 6 (six) AM. 100 tablet 0  ? Prenatal Vit-Fe Fumarate-FA (MULTIVITAMIN-PRENATAL) 27-0.8 MG TABS tablet Take 1 tablet by mouth daily at 12 noon.    ? ?No facility-administered medications prior to visit.  ? ? ?No Known Allergies ? ?ROS ?Review of Systems ? ?  ?Objective:  ?  ?Physical Exam ? ?LMP 04/09/2021 (Exact Date)  ?Wt Readings from Last 3 Encounters:  ?12/01/21 154 lb (69.9 kg)  ?11/24/21 155 lb (70.3 kg)  ?11/04/21 147 lb 6.4 oz (66.9 kg)  ? ? ? ?Health Maintenance Due  ?Topic Date Due  ? COVID-19 Vaccine (1) Never done  ? HIV Screening  Never done  ? INFLUENZA VACCINE  Never done  ? ? ?There are no preventive care reminders to display for this patient. ? ?No results found for: TSH ?Lab Results  ?Component Value Date  ? WBC 5.5 10/22/2021  ? HGB 10.2 (L) 10/22/2021  ? HCT 30.2 (L) 10/22/2021  ? MCV 88 10/22/2021  ? PLT 249 10/22/2021  ? ?No results found for: NA, K, CHLORIDE, CO2, GLUCOSE, BUN, CREATININE, BILITOT, ALKPHOS, AST, ALT, PROT, ALBUMIN, CALCIUM, ANIONGAP, EGFR, GFR ?No results found for: CHOL ?No results found for: HDL ?No  results found for: [place] ?No results found for: TRIG ?No results found for: CHOLHDL ?No results found for: HGBA1C ? ?  ?Assessment & Plan:  ? ?Problem List Items Addressed This Visit   ?None ? ? ?No orders of the defined types were placed in this encounter. ? ? ?Follow-up: No follow-ups on file.  ? ? ? ?

## 2021-12-18 LAB — 789231 7+OXYCODONE-BUND
Amphetamines, Urine: NEGATIVE ng/mL
BENZODIAZ UR QL: NEGATIVE ng/mL
Barbiturate screen, urine: NEGATIVE ng/mL
Cannabinoid Quant, Ur: NEGATIVE ng/mL
Cocaine (Metab.): NEGATIVE ng/mL
OPIATE SCREEN URINE: NEGATIVE ng/mL
Oxycodone/Oxymorphone, Urine: NEGATIVE ng/mL
PCP Quant, Ur: NEGATIVE ng/mL

## 2021-12-23 ENCOUNTER — Other Ambulatory Visit: Payer: Self-pay

## 2021-12-23 ENCOUNTER — Ambulatory Visit: Payer: Medicaid Other | Admitting: Advanced Practice Midwife

## 2021-12-23 VITALS — BP 118/79 | HR 84 | Temp 97.8°F | Wt 159.0 lb

## 2021-12-23 DIAGNOSIS — Z98891 History of uterine scar from previous surgery: Secondary | ICD-10-CM

## 2021-12-23 DIAGNOSIS — O99013 Anemia complicating pregnancy, third trimester: Secondary | ICD-10-CM

## 2021-12-23 DIAGNOSIS — O0930 Supervision of pregnancy with insufficient antenatal care, unspecified trimester: Secondary | ICD-10-CM

## 2021-12-23 DIAGNOSIS — Z3482 Encounter for supervision of other normal pregnancy, second trimester: Secondary | ICD-10-CM

## 2021-12-23 DIAGNOSIS — O4440 Low lying placenta NOS or without hemorrhage, unspecified trimester: Secondary | ICD-10-CM

## 2021-12-23 NOTE — Progress Notes (Signed)
Client self collect GBS and GC/CL samples successfully after instruction given. Reminded of upcoming preadmission appointments. Client asked if [facility] gives away car seats. Recommendation of best price for car seat given and free installation service by fire department. Alesia Banda RN ?

## 2021-12-23 NOTE — Progress Notes (Signed)
[facility] Department ?Maternal Health Clinic ? ?PRENATAL VISIT NOTE ? ?Subjective:  ?Maureen Harris is a 31 y.o. G3P1011 at [redacted]w[redacted]d being seen today for ongoing prenatal care.  She is currently monitored for the following issues for this high-risk pregnancy and has Late prenatal care 14 wks; Low birth weight infant 5#7 at term 05/03/13; Prenatal care, subsequent pregnancy, second trimester; Previous cesarean section 05/03/13; Alcohol use affecting pregnancy; last use 05/29/21; Abnormal blood finding 07/16/21; Abnormal hematological finding on antenatal screening of mother, antepartum; Low-lying placenta 1.9 cm from internal os on 08/28/21; and Anemia affecting pregnancy in third trimester on their problem list. ? ?Patient reports no complaints.  Contractions: Not present. Vag. Bleeding: None.  Movement: Present. Denies leaking of fluid/ROM.  ? ?The following portions of the patient's history were reviewed and updated as appropriate: allergies, current medications, past family history, past medical history, past social history, past surgical history and problem list. Problem list updated. ? ?Objective:  ? ?Vitals:  ? 12/23/21 1524  ?BP: 118/79  ?Pulse: 84  ?Temp: 97.8 ?F (36.6 ?C)  ?Weight: 159 lb (72.1 kg)  ? ? ?Fetal Status: Fetal Heart Rate (bpm): 140 Fundal Height: 38 cm Movement: Present  Presentation: Vertex ? ?General:  Alert, oriented and cooperative. Patient is in no acute distress.  ?Skin: Skin is warm and dry. No rash noted.   ?Cardiovascular: Normal heart rate noted  ?Respiratory: Normal respiratory effort, no problems with respiration noted  ?Abdomen: Soft, gravid, appropriate for gestational age.  Pain/Pressure: Absent     ?Pelvic: Cervical exam deferred        ?Extremities: Normal range of motion.  Edema: Mild pitting, slight indentation  ?Mental Status: Normal mood and affect. Normal behavior. Normal judgment and thought content.  ? ?Assessment and Plan:  ?Pregnancy: G3P1011 at [redacted]w[redacted]d ? ?1. Prenatal  care, subsequent pregnancy, second trimester ?Has 2 car seats and ready for baby at home ?Work note requested 8 hour days. Pt decided she wanted to quit working to rest and last day was 12/20/21 ?Walking 5x/wk x 30 min ?Self collected 36 wk labs ?29 lb (13.2 kg) ? ?- Chlamydia/GC NAA, Confirmation ?- GBS Culture ? ?2. Low-lying placenta 1.9 cm from internal os on 08/28/21 ?11/07/21 u/s with placenta 7 cm from internal os ? ?3. Anemia affecting pregnancy in third trimester ?Taking FeSo4 I daily with oj ? ?4. Previous cesarean section 05/03/13 ?Repeat c/s scheduled 01/07/22 ? ?5. Late prenatal care 14 wks ? ? ?6. Low birth weight infant 5#7 at term 05/03/13 ? ? ? ?Preterm labor symptoms and general obstetric precautions including but not limited to vaginal bleeding, contractions, leaking of fluid and fetal movement were reviewed in detail with the patient. ?Please refer to After Visit Summary for other counseling recommendations.  ?Return in about 1 week (around 12/30/2021) for routine PNC. ? ?Future Appointments  ?Date Time Provider [place]  ?12/29/2021  9:00 AM [facility] PAT[facility] None  ?12/30/2021 10:40 AM [facility] PROVIDER [facility] None  ?01/05/2022  8:15 AM [facility] None  ? ? ?Herbie Saxon, CNM ? ?

## 2021-12-25 LAB — CHLAMYDIA/GC NAA, CONFIRMATION
Chlamydia trachomatis, NAA: NEGATIVE
Neisseria gonorrhoeae, NAA: NEGATIVE

## 2021-12-25 NOTE — H&P (Signed)
OB History & Physical  ?  ?History of Present Illness:  ?Chief Complaint: Pre-op visit for c-section ?  ?HPI:  ?Maureen Harris is a 31 y.o. G40P1011 female at [redacted]w[redacted]d by LMP consistent with 10 week ultrasound.  Her pregnancy has been early low lying placenta (resolved), anemia in pregnancy (taking iron), history of cesarean section (previously counseled on TOLAC vs C-section), late entry into prenatal care, history of low birth weight infant (5#7 at term in 2014).   ?  ?She denies contractions.   She denies leakage of fluid.   She denies vaginal bleeding.   She reports fetal movement.   ?  ?  ?  ?Maternal Medical History:  ?  ?    ?Past Medical History:  ?Diagnosis Date  ? Anemia affecting pregnancy in third trimester    ?  ?  ?     ?Past Surgical History:  ?Procedure Laterality Date  ? CESAREAN SECTION   05/03/2013  ?  ?  ?No Known Allergies ?  ?     ?Prior to Admission medications   ?Medication Sig Taking? Last Dose  ?ferrous sulfate 325 (65 FE) MG tablet Take by mouth Yes Taking  ?prenatal vit no.124/iron/folic (PRENATAL VITAMIN ORAL) Take by mouth Yes Taking  ?  ?  ?                 ?OB History  ?Gravida Para Term Preterm AB Living  ?3 1 1   1 1   ?SAB IAB Ectopic Molar Multiple Live Births   ?  1       1  ?   ?# Outcome Date GA Lbr Len/2nd Weight Sex Delivery Anes PTL Lv  ?3 Current                    ?2 IAB 2015                  ?1 Term 05/03/13     2.466 kg (5 lb 7 oz) F CS-Unspec     LIV  ?  ?  ?Prenatal care site: [facility] ?  ?Social History: She  reports that she has never smoked. She has never used smokeless tobacco. She reports that she does not currently use alcohol. She reports that she does not currently use drugs. ?  ?Family History: family history includes High blood pressure (Hypertension) in her mother.  ?  ?Review of Systems  ?Constitutional: Negative.   ?HENT: Negative.   ?Eyes: Negative.   ?Respiratory: Negative.   ?Cardiovascular: Negative.   ?Gastrointestinal: Negative.   ?Genitourinary: Negative.    ?Musculoskeletal: Negative.   ?Skin: Negative.   ?Neurological: Negative.   ?Endo/Heme/Allergies: Negative.   ?Psychiatric/Behavioral: Negative.   ?  ?  ?Physical Exam:  ?BP 125/83   Pulse 85   Ht 162.6 cm (5\' 4" )   Wt 73.2 kg (161 lb 6.4 oz)   BMI 27.70 kg/m?   ?Physical Exam ?Constitutional:   ?   General: She is not in acute distress. ?   Appearance: Normal appearance.  ?HENT:  ?   Head: Normocephalic and atraumatic.  ?Eyes:  ?   General: No scleral icterus. ?   Conjunctiva/sclera: Conjunctivae normal.  ?Cardiovascular:  ?   Rate and Rhythm: Normal rate and regular rhythm.  ?   Heart sounds: No murmur heard. ?  No friction rub. No gallop.  ?Pulmonary:  ?   Effort: Pulmonary effort is normal. No respiratory distress.  ?   Breath sounds:  Normal breath sounds. No wheezing, rhonchi or rales.  ?Abdominal:  ?   General: Bowel sounds are normal. There is no distension.  ?   Palpations: Abdomen is soft. There is no mass.  ?   Tenderness: There is no abdominal tenderness. There is no guarding or rebound.  ?   Comments: Gravid, NT  ?Musculoskeletal:     ?   General: No swelling. Normal range of motion.  ?Neurological:  ?   General: No focal deficit present.  ?   Mental Status: She is oriented to person, place, and time.  ?   Cranial Nerves: No cranial nerve deficit.  ?Skin: ?   General: Skin is warm and dry.  ?   Findings: No lesion.  ?Psychiatric:     ?   Mood and Affect: Mood normal.     ?   Behavior: Behavior normal.     ?   Judgment: Judgment normal.  ?Vitals and nursing note reviewed.   ?  ?  ?Pertinent Results:  ?Prenatal Labs ?Blood type/Rh B  ?Antibody screen positive  ?Rubella Immune  ?Varicella Immune  ?  ?RPR NR  ?HBsAg negative  ?HIV negative  ?GC negative  ?Chlamydia negative  ?Genetic screening unsure  ?1 hour GTT 109  ?3 hour GTT n/a  ?GBS pending  ?  ?Placenta is posterior, was low-lying - resolved ?  ?Assessment:  ?Maureen Harris is a 31 y.o. G52P1011 female at [redacted]w[redacted]d with history of c-section, desires  repeat. Extensive counseling already performed..  ?  ?Plan:  ?Admit to Labor & Delivery  ?CBC, T&S, NPO, IVF ?GBS pending.   ?Consents signed.  Patient agrees to proceed.  ?  ?Rubye Oaks, MD ?12/25/2021 11:30 AM  ?

## 2021-12-27 LAB — CULTURE, BETA STREP (GROUP B ONLY): Strep Gp B Culture: NEGATIVE

## 2021-12-29 ENCOUNTER — Encounter
Admission: RE | Admit: 2021-12-29 | Discharge: 2021-12-29 | Disposition: A | Discharge status: Home / Self Care | Payer: Medicaid Other | Source: Ambulatory Visit | Attending: Obstetrics and Gynecology | Admitting: Obstetrics and Gynecology

## 2021-12-29 ENCOUNTER — Other Ambulatory Visit: Payer: Self-pay

## 2021-12-29 NOTE — Patient Instructions (Addendum)
Your procedure is scheduled on: 01/07/2022 ?Report to the Registration Desk on the 1st floor of the [facility]. ?To find out your arrival time, please call [phone] between 1PM - 3PM on: 01/06/2022 ? ?REMEMBER: ?Instructions that are not followed completely may result in serious medical risk, up to and including death; or upon the discretion of your surgeon and anesthesiologist your surgery may need to be rescheduled. ? ?Do not eat food after midnight the night before surgery.  ?No gum chewing, lozengers or hard candies. ? ? ? ? ?TAKE THESE MEDICATIONS THE MORNING OF SURGERY WITH A SIP OF WATER: ?Pre-natal ?Iron ? ? ? ?One week prior to surgery: ?Stop Anti-inflammatories (NSAIDS) such as Advil, Aleve, Ibuprofen, Motrin, Naproxen, Naprosyn and Aspirin based products such as Excedrin, Goodys Powder, BC Powder. ? ?Stop ANY OVER THE COUNTER supplements until after surgery except pre-natal and iron.  ?You may however, continue to take Tylenol if needed for pain up until the day of surgery. ? ?No Alcohol for 24 hours before or after surgery. ? ?No Smoking including e-cigarettes for 24 hours prior to surgery.  ?No chewable tobacco products for at least 6 hours prior to surgery.  ?No nicotine patches on the day of surgery. ? ?Do not use any "recreational" drugs for at least a week prior to your surgery.  ?Please be advised that the combination of cocaine and anesthesia may have negative outcomes, up to and including death. ?If you test positive for cocaine, your surgery will be cancelled. ? ?On the morning of surgery brush your teeth with toothpaste and water, you may rinse your mouth with mouthwash if you wish. ?Do not swallow any toothpaste or mouthwash. ? ?Use CHG Soap or wipes as directed on instruction sheet-provided for you.There's two sheets in each pack. Do six sheets ( 3 packs) night before and 3 packs again in the am. Wipe down both legs, both arms, abdomen area and back. One sheet each extremity. Don't wipe  your breast and under breast in case you will  be breast- feeding.  ? ?Do not wear jewelry, make-up, hairpins, clips or nail polish. ? ?Do not wear lotions, powders, or perfumes.  ? ?Do not shave body from the neck down 48 hours prior to surgery just in case you cut yourself which could leave a site for infection.  ?Also, freshly shaved skin may become irritated if using the CHG soap. ? ?Contact lenses, hearing aids and dentures may not be worn into surgery. ? ?Do not bring valuables to the hospital. [facility] is not responsible for any missing/lost belongings or valuables.  ? ? ?Notify your doctor if there is any change in your medical condition (cold, fever, infection). ? ?Wear comfortable clothing (specific to your surgery type) to the hospital. ? ?After surgery, you can help prevent lung complications by doing breathing exercises.  ?Take deep breaths and cough every 1-2 hours. Your doctor may order a device called an Incentive Spirometer to help you take deep breaths. ? ? ?If you are being admitted to the hospital overnight, leave your suitcase in the car. ?After surgery it may be brought to your room. ? ?If you are being discharged the day of surgery, you will not be allowed to drive home. ?You will need a responsible adult (18 years or older) to drive you home and stay with you that night.  ? ?If you are taking public transportation, you will need to have a responsible adult (18 years or older) with you. ?Please  confirm with your physician that it is acceptable to use public transportation.  ? ?Please call the [facility] Dept. at [phone] if you have any questions about these instructions. ? ?Surgery Visitation Policy: ? ?Patients undergoing a surgery or procedure may have two family members or support persons with them as long as the person is not COVID-19 positive or experiencing its symptoms.  ? ?Inpatient Visitation:   ? ?Visiting hours are 7 a.m. to 8 p.m. ?Up to four visitors are  allowed at one time in a patient room, including children. The visitors may rotate out with other people during the day. One designated support person (adult) may remain overnight.  ?

## 2021-12-30 ENCOUNTER — Encounter
Admission: EM | Disposition: A | Discharge status: Home / Self Care | Payer: Self-pay | Source: Home / Self Care | Attending: Obstetrics

## 2021-12-30 ENCOUNTER — Encounter: Payer: Self-pay | Admitting: Obstetrics and Gynecology

## 2021-12-30 ENCOUNTER — Inpatient Hospital Stay
Admission: EM | Admit: 2021-12-30 | Discharge: 2022-01-02 | DRG: 787 | Disposition: A | Discharge status: Home / Self Care | Payer: Medicaid Other | Attending: Obstetrics | Admitting: Obstetrics

## 2021-12-30 ENCOUNTER — Inpatient Hospital Stay: Payer: Medicaid Other | Admitting: Certified Registered Nurse Anesthetist

## 2021-12-30 ENCOUNTER — Ambulatory Visit: Payer: Medicaid Other | Admitting: Advanced Practice Midwife

## 2021-12-30 ENCOUNTER — Other Ambulatory Visit: Payer: Self-pay

## 2021-12-30 VITALS — BP 156/89 | HR 65 | Temp 97.8°F | Wt 158.0 lb

## 2021-12-30 DIAGNOSIS — O1405 Mild to moderate pre-eclampsia, complicating the puerperium: Secondary | ICD-10-CM | POA: Diagnosis present

## 2021-12-30 DIAGNOSIS — Z3482 Encounter for supervision of other normal pregnancy, second trimester: Secondary | ICD-10-CM

## 2021-12-30 DIAGNOSIS — O9081 Anemia of the puerperium: Secondary | ICD-10-CM | POA: Diagnosis not present

## 2021-12-30 DIAGNOSIS — Z3483 Encounter for supervision of other normal pregnancy, third trimester: Secondary | ICD-10-CM

## 2021-12-30 DIAGNOSIS — O0933 Supervision of pregnancy with insufficient antenatal care, third trimester: Secondary | ICD-10-CM

## 2021-12-30 DIAGNOSIS — Z98891 History of uterine scar from previous surgery: Secondary | ICD-10-CM

## 2021-12-30 DIAGNOSIS — O99013 Anemia complicating pregnancy, third trimester: Secondary | ICD-10-CM

## 2021-12-30 DIAGNOSIS — D62 Acute posthemorrhagic anemia: Secondary | ICD-10-CM | POA: Diagnosis not present

## 2021-12-30 DIAGNOSIS — Z3A37 37 weeks gestation of pregnancy: Secondary | ICD-10-CM | POA: Diagnosis not present

## 2021-12-30 DIAGNOSIS — O0930 Supervision of pregnancy with insufficient antenatal care, unspecified trimester: Secondary | ICD-10-CM

## 2021-12-30 DIAGNOSIS — O34211 Maternal care for low transverse scar from previous cesarean delivery: Secondary | ICD-10-CM | POA: Diagnosis present

## 2021-12-30 DIAGNOSIS — O1403 Mild to moderate pre-eclampsia, third trimester: Secondary | ICD-10-CM | POA: Diagnosis present

## 2021-12-30 LAB — COMPREHENSIVE METABOLIC PANEL
ALT: 40 U/L (ref 0–44)
AST: 39 U/L (ref 15–41)
Albumin: 2.7 g/dL — ABNORMAL LOW (ref 3.5–5.0)
Alkaline Phosphatase: 163 U/L — ABNORMAL HIGH (ref 38–126)
Anion gap: 8 (ref 5–15)
BUN: 12 mg/dL (ref 6–20)
CO2: 20 mmol/L — ABNORMAL LOW (ref 22–32)
Calcium: 8.4 mg/dL — ABNORMAL LOW (ref 8.9–10.3)
Chloride: 106 mmol/L (ref 98–111)
Creatinine, Ser: 0.62 mg/dL (ref 0.44–1.00)
GFR, Estimated: >60 mL/min (ref >60)
Glucose, Bld: 93 mg/dL (ref 70–99)
Potassium: 3.9 mmol/L (ref 3.5–5.1)
Sodium: 134 mmol/L — ABNORMAL LOW (ref 135–145)
Total Bilirubin: 0.8 mg/dL (ref 0.3–1.2)
Total Protein: 6.6 g/dL (ref 6.5–8.1)

## 2021-12-30 LAB — CBC
HCT: 31.6 % — ABNORMAL LOW (ref 36.0–46.0)
Hemoglobin: 10.2 g/dL — ABNORMAL LOW (ref 12.0–15.0)
MCH: 27.6 pg (ref 26.0–34.0)
MCHC: 32.3 g/dL (ref 30.0–36.0)
MCV: 85.6 fL (ref 80.0–100.0)
Platelets: 174 10*3/uL (ref 150–400)
RBC: 3.69 MIL/uL — ABNORMAL LOW (ref 3.87–5.11)
RDW: 13.4 % (ref 11.5–15.5)
WBC: 4.4 10*3/uL (ref 4.0–10.5)
nRBC: 0 % (ref 0.0–0.2)

## 2021-12-30 LAB — PROTEIN / CREATININE RATIO, URINE
Creatinine, Urine: 66 mg/dL
Protein Creatinine Ratio: 0.36 mg/mg{Cre} — ABNORMAL HIGH (ref 0.00–0.15)
Total Protein, Urine: 24 mg/dL

## 2021-12-30 LAB — URINALYSIS
Bilirubin, UA: NEGATIVE
Glucose, UA: NEGATIVE
Ketones, UA: NEGATIVE
Nitrite, UA: NEGATIVE
Specific Gravity, UA: 1.01 (ref 1.005–1.030)
Urobilinogen, Ur: 0.2 mg/dL (ref 0.2–1.0)
pH, UA: 7 (ref 5.0–7.5)

## 2021-12-30 LAB — RAPID HIV SCREEN (HIV 1/2 AB+AG)
HIV 1/2 Antibodies: NONREACTIVE
HIV-1 P24 Antigen - HIV24: NONREACTIVE

## 2021-12-30 LAB — ABO/RH: ABO/RH(D): B POS

## 2021-12-30 LAB — HEMOGLOBIN, FINGERSTICK: Hemoglobin: 10.5 g/dL — ABNORMAL LOW (ref 11.1–15.9)

## 2021-12-30 SURGERY — Surgical Case
Anesthesia: Spinal

## 2021-12-30 MED ORDER — ACETAMINOPHEN 325 MG PO TABS: 650.0000 mg | ORAL_TABLET | ORAL | Status: DC | PRN

## 2021-12-30 MED ORDER — ESMOLOL HCL 100 MG/10ML IV SOLN
INTRAVENOUS | Status: AC
  Filled 2021-12-30: qty 10

## 2021-12-30 MED ORDER — ONDANSETRON HCL 4 MG/2ML IJ SOLN
4.0000 mg | Freq: Three times a day (TID) | INTRAMUSCULAR | Status: DC | PRN
  Administered 2022-01-01: 4 mg via INTRAVENOUS
  Filled 2021-12-30: qty 2

## 2021-12-30 MED ORDER — BUPIVACAINE 0.25 % ON-Q PUMP DUAL CATH 400 ML
400.0000 mL | INJECTION | Status: DC
  Filled 2021-12-30: qty 400

## 2021-12-30 MED ORDER — FENTANYL CITRATE (PF) 100 MCG/2ML IJ SOLN
INTRAMUSCULAR | Status: DC | PRN
  Administered 2021-12-30: 20 ug via INTRATHECAL

## 2021-12-30 MED ORDER — KETOROLAC TROMETHAMINE 30 MG/ML IJ SOLN
30.0000 mg | Freq: Four times a day (QID) | INTRAMUSCULAR | Status: AC | PRN
  Administered 2021-12-31 (×3): 30 mg via INTRAVENOUS
  Filled 2021-12-30 (×3): qty 1

## 2021-12-30 MED ORDER — MORPHINE SULFATE (PF) 0.5 MG/ML IJ SOLN
INTRAMUSCULAR | Status: AC
  Filled 2021-12-30: qty 10

## 2021-12-30 MED ORDER — OXYTOCIN-SODIUM CHLORIDE 30-0.9 UT/500ML-% IV SOLN
INTRAVENOUS | Status: AC
  Filled 2021-12-30: qty 1000

## 2021-12-30 MED ORDER — MIDAZOLAM HCL 2 MG/2ML IJ SOLN
INTRAMUSCULAR | Status: AC
  Filled 2021-12-30: qty 2

## 2021-12-30 MED ORDER — LABETALOL HCL 5 MG/ML IV SOLN
40.0000 mg | INTRAVENOUS | Status: DC | PRN
  Administered 2021-12-30: 40 mg via INTRAVENOUS

## 2021-12-30 MED ORDER — HYDRALAZINE HCL 20 MG/ML IJ SOLN: 10.0000 mg | INTRAMUSCULAR | Status: DC | PRN

## 2021-12-30 MED ORDER — SOD CITRATE-CITRIC ACID 500-334 MG/5ML PO SOLN
30.0000 mL | ORAL | Status: AC
  Administered 2021-12-30: 30 mL via ORAL

## 2021-12-30 MED ORDER — BUPIVACAINE HCL (PF) 0.5 % IJ SOLN
5.0000 mL | Freq: Once | INTRAMUSCULAR | Status: DC
  Filled 2021-12-30: qty 10

## 2021-12-30 MED ORDER — LABETALOL HCL 5 MG/ML IV SOLN
INTRAVENOUS | Status: AC
  Filled 2021-12-30: qty 4

## 2021-12-30 MED ORDER — ACETAMINOPHEN 500 MG PO TABS
1000.0000 mg | ORAL_TABLET | Freq: Four times a day (QID) | ORAL | Status: AC
  Administered 2021-12-31 (×4): 1000 mg via ORAL
  Filled 2021-12-30 (×4): qty 2

## 2021-12-30 MED ORDER — ONDANSETRON HCL 4 MG/2ML IJ SOLN
INTRAMUSCULAR | Status: DC | PRN
Start: 2021-12-30 — End: 2021-12-31
  Administered 2021-12-30: 4 mg via INTRAVENOUS

## 2021-12-30 MED ORDER — MAGNESIUM SULFATE 40 GM/1000ML IV SOLN
2.0000 g/h | INTRAVENOUS | Status: DC
  Administered 2021-12-30 – 2021-12-31 (×2): 2 g/h via INTRAVENOUS
  Filled 2021-12-30: qty 1000

## 2021-12-30 MED ORDER — MIDAZOLAM HCL 2 MG/2ML IJ SOLN
INTRAMUSCULAR | Status: DC | PRN
Start: 2021-12-30 — End: 2021-12-31
  Administered 2021-12-30: .5 mg via INTRAVENOUS
  Administered 2021-12-30 (×2): 1 mg via INTRAVENOUS
  Administered 2021-12-30: .5 mg via INTRAVENOUS

## 2021-12-30 MED ORDER — OXYTOCIN-SODIUM CHLORIDE 30-0.9 UT/500ML-% IV SOLN
2.5000 [IU]/h | INTRAVENOUS | Status: AC
  Administered 2021-12-30: 2.5 [IU]/h via INTRAVENOUS

## 2021-12-30 MED ORDER — OXYCODONE-ACETAMINOPHEN 5-325 MG PO TABS
2.0000 | ORAL_TABLET | ORAL | Status: DC | PRN
  Administered 2021-12-31 – 2022-01-01 (×3): 2 via ORAL
  Filled 2021-12-30 (×4): qty 2

## 2021-12-30 MED ORDER — ORAL CARE MOUTH RINSE
15.0000 mL | Freq: Once | OROMUCOSAL | Status: AC
Start: 2021-12-30 — End: 2021-12-30

## 2021-12-30 MED ORDER — MAGNESIUM SULFATE 40 GM/1000ML IV SOLN
INTRAVENOUS | Status: AC
  Filled 2021-12-30: qty 1000

## 2021-12-30 MED ORDER — FENTANYL CITRATE (PF) 100 MCG/2ML IJ SOLN
INTRAMUSCULAR | Status: AC
Start: 2021-12-30 — End: ?
  Filled 2021-12-30: qty 2

## 2021-12-30 MED ORDER — 0.9 % SODIUM CHLORIDE (POUR BTL) OPTIME
TOPICAL | Status: DC | PRN
Start: 2021-12-30 — End: 2021-12-30
  Administered 2021-12-30: 500 mL

## 2021-12-30 MED ORDER — MAGNESIUM SULFATE BOLUS VIA INFUSION
4.0000 g | Freq: Once | INTRAVENOUS | Status: AC
  Administered 2021-12-30: 4 g via INTRAVENOUS
  Filled 2021-12-30: qty 1000

## 2021-12-30 MED ORDER — IBUPROFEN 600 MG PO TABS
600.0000 mg | ORAL_TABLET | Freq: Four times a day (QID) | ORAL | Status: DC
  Administered 2021-12-31 – 2022-01-02 (×7): 600 mg via ORAL
  Filled 2021-12-30 (×7): qty 1

## 2021-12-30 MED ORDER — CEFAZOLIN SODIUM-DEXTROSE 2-4 GM/100ML-% IV SOLN
2.0000 g | INTRAVENOUS | Status: AC
  Administered 2021-12-30: 2 g via INTRAVENOUS
  Filled 2021-12-30: qty 100

## 2021-12-30 MED ORDER — BUPIVACAINE HCL (PF) 0.5 % IJ SOLN
INTRAMUSCULAR | Status: DC | PRN
  Administered 2021-12-30: 10 mL

## 2021-12-30 MED ORDER — FENTANYL CITRATE (PF) 100 MCG/2ML IJ SOLN
25.0000 ug | INTRAMUSCULAR | Status: DC | PRN
  Administered 2021-12-30: 25 ug via INTRAVENOUS
  Filled 2021-12-30: qty 2

## 2021-12-30 MED ORDER — OXYTOCIN-SODIUM CHLORIDE 30-0.9 UT/500ML-% IV SOLN
INTRAVENOUS | Status: DC | PRN
  Administered 2021-12-30: 299 mL via INTRAVENOUS

## 2021-12-30 MED ORDER — LABETALOL HCL 5 MG/ML IV SOLN: 80.0000 mg | INTRAVENOUS | Status: DC | PRN

## 2021-12-30 MED ORDER — LACTATED RINGERS IV SOLN: INTRAVENOUS | Status: DC

## 2021-12-30 MED ORDER — CALCIUM CARBONATE ANTACID 500 MG PO CHEW: 2.0000 | CHEWABLE_TABLET | ORAL | Status: DC | PRN

## 2021-12-30 MED ORDER — OXYCODONE-ACETAMINOPHEN 5-325 MG PO TABS: 2.0000 | ORAL_TABLET | ORAL | Status: DC | PRN

## 2021-12-30 MED ORDER — BUPIVACAINE IN DEXTROSE 0.75-8.25 % IT SOLN
INTRATHECAL | Status: DC | PRN
Start: 2021-12-30 — End: 2021-12-31
  Administered 2021-12-30: 1.6 mL via INTRATHECAL

## 2021-12-30 MED ORDER — CHLORHEXIDINE GLUCONATE 0.12 % MT SOLN
15.0000 mL | Freq: Once | OROMUCOSAL | Status: AC
  Administered 2021-12-30: 15 mL via OROMUCOSAL
  Filled 2021-12-30: qty 15

## 2021-12-30 MED ORDER — OXYCODONE-ACETAMINOPHEN 5-325 MG PO TABS
1.0000 | ORAL_TABLET | ORAL | Status: DC | PRN
  Administered 2022-01-01 – 2022-01-02 (×3): 1 via ORAL
  Filled 2021-12-30 (×2): qty 1

## 2021-12-30 MED ORDER — KETOROLAC TROMETHAMINE 30 MG/ML IJ SOLN
INTRAMUSCULAR | Status: DC | PRN
  Administered 2021-12-30: 30 mg via INTRAVENOUS

## 2021-12-30 MED ORDER — MORPHINE SULFATE (PF) 0.5 MG/ML IJ SOLN
INTRAMUSCULAR | Status: DC | PRN
Start: 2021-12-30 — End: 2021-12-31
  Administered 2021-12-30: .1 mg via INTRATHECAL

## 2021-12-30 MED ORDER — FENTANYL CITRATE (PF) 100 MCG/2ML IJ SOLN
INTRAMUSCULAR | Status: DC | PRN
  Administered 2021-12-30: 25 ug via INTRAVENOUS
  Administered 2021-12-30: 50 ug via INTRAVENOUS
  Administered 2021-12-30: 30 ug via INTRAVENOUS
  Administered 2021-12-30: 25 ug via INTRAVENOUS

## 2021-12-30 MED ORDER — SOD CITRATE-CITRIC ACID 500-334 MG/5ML PO SOLN
ORAL | Status: AC
Start: 2021-12-30 — End: 2021-12-30
  Filled 2021-12-30: qty 15

## 2021-12-30 MED ORDER — LABETALOL HCL 5 MG/ML IV SOLN
20.0000 mg | INTRAVENOUS | Status: DC | PRN
  Administered 2021-12-30: 20 mg via INTRAVENOUS

## 2021-12-30 MED ORDER — OXYCODONE-ACETAMINOPHEN 5-325 MG PO TABS: 1.0000 | ORAL_TABLET | ORAL | Status: DC | PRN

## 2021-12-30 MED ORDER — PRENATAL MULTIVITAMIN CH: 1.0000 | ORAL_TABLET | Freq: Every day | ORAL | Status: DC

## 2021-12-30 MED ORDER — KETOROLAC TROMETHAMINE 30 MG/ML IJ SOLN: 30.0000 mg | Freq: Four times a day (QID) | INTRAMUSCULAR | Status: AC | PRN

## 2021-12-30 MED ORDER — ONDANSETRON HCL 4 MG/2ML IJ SOLN: 4.0000 mg | Freq: Once | INTRAMUSCULAR | Status: DC | PRN

## 2021-12-30 MED ORDER — PHENYLEPHRINE HCL-NACL 20-0.9 MG/250ML-% IV SOLN
INTRAVENOUS | Status: AC
Start: 2021-12-30 — End: ?
  Filled 2021-12-30: qty 250

## 2021-12-30 SURGICAL SUPPLY — 38 items
BACTOSHIELD CHG 4% 4OZ (MISCELLANEOUS) ×1
CATH KIT ON-Q SILVERSOAK 5 (CATHETERS) ×2 IMPLANT
CATH KIT ON-Q SILVERSOAK 5IN (CATHETERS) ×4 IMPLANT
DERMABOND ADHESIVE PROPEN (GAUZE/BANDAGES/DRESSINGS) ×1
DERMABOND ADVANCED (GAUZE/BANDAGES/DRESSINGS) ×1
DERMABOND ADVANCED .7 DNX12 (GAUZE/BANDAGES/DRESSINGS) ×1 IMPLANT
DERMABOND ADVANCED .7 DNX6 (GAUZE/BANDAGES/DRESSINGS) IMPLANT
DRSG EMULSION OIL 3X8 NADH (GAUZE/BANDAGES/DRESSINGS) ×1 IMPLANT
DRSG OPSITE POSTOP 4X10 (GAUZE/BANDAGES/DRESSINGS) ×2 IMPLANT
DRSG TELFA 3X8 NADH (GAUZE/BANDAGES/DRESSINGS) ×2 IMPLANT
ELECT CAUTERY BLADE 6.4 (BLADE) ×2 IMPLANT
ELECT REM PT RETURN 9FT ADLT (ELECTROSURGICAL) ×2
ELECTRODE REM PT RTRN 9FT ADLT (ELECTROSURGICAL) ×1 IMPLANT
GAUZE SPONGE 4X4 12PLY STRL (GAUZE/BANDAGES/DRESSINGS) ×2 IMPLANT
GAUZE SPONGE 4X4 12PLY STRL LF (GAUZE/BANDAGES/DRESSINGS) ×1 IMPLANT
GLOVE SURG ENC MOIS LTX SZ7 (GLOVE) ×2 IMPLANT
GLOVE SURG UNDER LTX SZ7.5 (GLOVE) ×2 IMPLANT
GOWN STRL REUS W/ TWL LRG LVL3 (GOWN DISPOSABLE) ×3 IMPLANT
GOWN STRL REUS W/TWL LRG LVL3 (GOWN DISPOSABLE) ×3
MANIFOLD NEPTUNE II (INSTRUMENTS) ×2 IMPLANT
MAT PREVALON FULL STRYKER (MISCELLANEOUS) ×2 IMPLANT
NS IRRIG 1000ML POUR BTL (IV SOLUTION) ×2 IMPLANT
PACK C SECTION AR (MISCELLANEOUS) ×2 IMPLANT
PAD DRESSING TELFA 3X8 NADH (GAUZE/BANDAGES/DRESSINGS) ×1 IMPLANT
PAD OB MATERNITY 4.3X12.25 (PERSONAL CARE ITEMS) ×4 IMPLANT
PAD PREP 24X41 OB/GYN DISP (PERSONAL CARE ITEMS) ×2 IMPLANT
SCRUB CHG 4% DYNA-HEX 4OZ (MISCELLANEOUS) ×1 IMPLANT
STRIP CLOSURE SKIN 1/2X4 (GAUZE/BANDAGES/DRESSINGS) ×2 IMPLANT
SUT MNCRL 4-0 (SUTURE) ×1
SUT MNCRL 4-0 27XMFL (SUTURE) ×1
SUT PDS AB 1 TP1 96 (SUTURE) ×2 IMPLANT
SUT PLAIN GUT 0 (SUTURE) IMPLANT
SUT VIC AB 0 CTX 36 (SUTURE) ×2
SUT VIC AB 0 CTX36XBRD ANBCTRL (SUTURE) ×2 IMPLANT
SUTURE MNCRL 4-0 27XMF (SUTURE) ×1 IMPLANT
SWABSTK COMLB BENZOIN TINCTURE (MISCELLANEOUS) ×2 IMPLANT
TAPE PAPER 2X10 WHT MICROPORE (GAUZE/BANDAGES/DRESSINGS) ×1 IMPLANT
WATER STERILE IRR 500ML POUR (IV SOLUTION) ×2 IMPLANT

## 2021-12-30 NOTE — Discharge Summary (Signed)
? ?  Postpartum Discharge Summary ? ?  ?Patient Name: Maureen Harris ?DOB: [birth year] ?MRN: [id number] ? ?Date of admission: 12/30/2021 ?Delivery date:12/30/2021  ?Delivering provider: Prentice Docker D  ?Date of discharge: 01/02/2022 ? ?Admitting diagnosis: Elevated blood pressure affecting pregnancy in third trimester, antepartum [O16.3] ?History of cesarean delivery [Z98.891] ?Intrauterine pregnancy: [redacted]w[redacted]d     ?Secondary diagnosis:  Principal Problem: ?  Antepartum mild preeclampsia, third trimester ?Active Problems: ?  Late prenatal care 14 wks ?  Prenatal care, subsequent pregnancy, second trimester ?  History of cesarean delivery ?  [redacted] weeks gestation of pregnancy ? ?Additional problems: none    ?Discharge diagnosis: Term Pregnancy Delivered and Preeclampsia (mild)                                              ?Post partum procedures:blood transfusion ?Augmentation: N/A ?Complications: None ? ?Hospital course: The patient was sent from [facility] for elevated blood pressures. She was evaluated on L&D and diagnosed with preeclampsia without severe features.  She had a history of a cesarean section and desired a repeat c-section. She was taken to the OR for a repeat c-section which occurred without incident.  Her postpartum course was notable for acute blood loss anemia requiring a blood transfusion and postpartum preeclampsia.  She received 24 hours of magnesium sulfate.  Blood pressures were controlled with $RemoveBeforeD'60mg'gFEcwBuwxTiSOf$  of Procardia. She was deemed stable for discharge and discharged home with infant for follow up outpatient. ? ?Magnesium Sulfate received: No ?BMZ received: No ?Rhophylac:N/A ?MMR:N/A ?T-DaP:Given prenatally ?Flu: No ?Transfusion:Yes ? ?Physical exam  ?Vitals:  ? 01/01/22 2315 01/02/22 0313 01/02/22 0325 01/02/22 0725  ?BP: 127/88 101/89 126/89 (!) 120/91  ?Pulse: (!) 105 94 91 99  ?Resp: $Remov'18 18 18 19  'aNJcPX$ ?Temp: 98.8 ?F (37.1 ?C) 99.4 ?F (37.4 ?C) 98.5 ?F (36.9 ?C) 98.6 ?F (37 ?C)  ?TempSrc: Oral  Oral Oral  ?SpO2: 100%  100% 100% 100%  ?Weight:      ?Height:      ? ?General: alert, cooperative, and no distress ?Lochia: appropriate ?Uterine Fundus: firm ?Incision: Dressing is clean, dry, and intact, occlusive OP site honeycomb dressing intact  ?DVT Evaluation: No evidence of DVT seen on physical exam. ?Labs: ?Lab Results  ?Component Value Date  ? WBC 11.6 (H) 01/02/2022  ? HGB 8.4 (L) 01/02/2022  ? HCT 25.9 (L) 01/02/2022  ? MCV 87.2 01/02/2022  ? PLT 253 01/02/2022  ? ? ?  Latest Ref Rng & Units 01/01/2022  ?  6:26 AM  ?CMP  ?Glucose 70 - 99 mg/dL 94    ?BUN 6 - 20 mg/dL 6    ?Creatinine 0.44 - 1.00 mg/dL 0.68    ?Sodium 135 - 145 mmol/L 136    ?Potassium 3.5 - 5.1 mmol/L 4.1    ?Chloride 98 - 111 mmol/L 109    ?CO2 22 - 32 mmol/L 21    ?Calcium 8.9 - 10.3 mg/dL 6.8    ?Total Protein 6.5 - 8.1 g/dL 5.6    ?Total Bilirubin 0.3 - 1.2 mg/dL 0.6    ?Alkaline Phos 38 - 126 U/L 120    ?AST 15 - 41 U/L 36    ?ALT 0 - 44 U/L 34    ? ?Edinburgh Score: ? ?  01/01/2022  ?  4:01 PM  ?Flavia Shipper Postnatal Depression Scale Screening Tool  ?I have been  able to laugh and see the funny side of things. 0  ?I have looked forward with enjoyment to things. 0  ?I have blamed myself unnecessarily when things went wrong. 0  ?I have been anxious or worried for no good reason. 0  ?I have felt scared or panicky for no good reason. 0  ?Things have been getting on top of me. 0  ?I have been so unhappy that I have had difficulty sleeping. 0  ?I have felt sad or miserable. 0  ?I have been so unhappy that I have been crying. 0  ?The thought of harming myself has occurred to me. 0  ?Edinburgh Postnatal Depression Scale Total 0  ? ? ? ? ?After visit meds:  ?Allergies as of 01/02/2022   ?No Known Allergies ?  ? ?  ?Medication List  ?  ? ?TAKE these medications   ? ?ibuprofen 600 MG tablet ?Commonly known as: ADVIL ?Take 1 tablet (600 mg total) by mouth every 6 (six) hours as needed for mild pain or cramping. ?  ?Iron (Ferrous Sulfate) 325 (65 Fe) MG Tabs ?Take 1 tablet by  mouth daily at 6 (six) AM. ?  ?multivitamin-prenatal 27-0.8 MG Tabs tablet ?Take 1 tablet by mouth daily at 12 noon. ?  ?NIFEdipine 60 MG 24 hr tablet ?Commonly known as: ADALAT CC ?Take 1 tablet (60 mg total) by mouth daily. ?Start taking on: January 03, 2022 ?  ?oxyCODONE-acetaminophen 5-325 MG tablet ?Commonly known as: PERCOCET/ROXICET ?Take 1 tablet by mouth every 4 (four) hours as needed for up to 7 days for moderate pain (pain score 4-7/10). ?  ? ?  ? ? ? ?Discharge home in stable condition ?Infant Feeding: Bottle ?Infant Disposition:home with mother ?Discharge instruction: per After Visit Summary and Postpartum booklet. ?Activity: Advance as tolerated. Pelvic rest for 6 weeks.  ?Diet: routine diet ?Anticipated Birth Control: Depo ?Postpartum Appointment:6 weeks ?Additional Postpartum F/U: Incision check 1 week ?Future Appointments: ?Future Appointments  ?Date Time Provider [place]  ?01/05/2022  8:15 AM [facility] None  ? ?Follow up Visit: ? Follow-up Information   ? ? Will Bonnet, MD. Schedule an appointment as soon as possible for a visit in 1 week(s).   ?Specialty: Obstetrics and Gynecology ?Why: For incision check ?Contact information: ?[address] ?[place] [address] ?[phone] ? ? ?  ?  ? ? [facility]. Schedule an appointment as soon as possible for a visit on 01/06/2022.   ?Why: blood pressure check ?Contact information: ?[address] ?[address] ?[phone] ? ?  ?  ? ? [facility] .   ?Contact information: ?[address] ?[place] ?[phone] ? ?  ?  ? ?  ?  ? ?  ? ? ?SIGNED: ?Drinda Butts, CNM ?Certified Nurse Midwife ?[facility] ?[facility]   ? ?

## 2021-12-30 NOTE — Op Note (Signed)
Cesarean Section Operative Note   ? ?Patient Name: Maureen Harris  ?MRN: [id number]  ?Date of Surgery: 12/30/2021  ? ?Pre-operative Diagnosis:  ?1) History of cesarean section, desires repeat ?2) Preeclampsia without severe features, third trimester ?3) intrauterine pregnancy at [redacted]w[redacted]d  ? ?Post-operative Diagnosis:  ?1) History of cesarean section, desires repeat ?2) Preeclampsia without severe features, third trimester ?3) intrauterine pregnancy at [redacted]w[redacted]d   ? ?Procedure: Repeat Low Transverse Cesarean Section via Pfannenstiel incision with double layer uterine closure ? ?Surgeon: Moishe Spice) and Role: ?   * Conard Novak, MD - Primary  ? ?Assistants: Donato Schultz, CNM; No other capable assistant available, in surgery requiring high level assistant. ? ?Anesthesia: spinal  ? ?Findings:  ?1) normal appearing gravid uterus, fallopian tubes, and ovaries ?2) viable female infant with APGARs 9 and 9, weight 3,330 grams ?  ?Quantified Blood Loss: 990 mL ? ?Total IV Fluids: 800 ml  ?  ?Urine Output:  100 mL clear urine at end of procedure ? ?Specimens: none ? ?Complications: no complications ? ?Disposition: PACU - hemodynamically stable.  ? ?Maternal Condition: stable  ? ?Baby condition / location:  Couplet care / Skin to Skin ? ?Procedure Details:  ?The patient was seen in the Holding Room. The risks, benefits, complications, treatment options, and expected outcomes were discussed with the patient. The patient concurred with the proposed plan, giving informed consent. identified as Maureen Harris and the procedure verified as C-Section Delivery. A Time Out was held and the above information confirmed.  ? ?After induction of anesthesia, the patient was draped and prepped in the usual sterile manner. A Pfannenstiel incision was made and carried down through the subcutaneous tissue to the fascia. Fascial incision was made and extended transversely. The fascia was separated from the underlying rectus tissue superiorly and  inferiorly. The peritoneum was identified and entered. Peritoneal incision was extended longitudinally. The bladder flap was not bluntly or sharply freed from the lower uterine segment. A low transverse uterine incision was made and the hysterotomy was extended with cranial-caudal tension. Delivered from cephalic presentation was a 3,330 gram Living newborn infant(s) or Female with Apgar scores of 9 at one minute and 9 at five minutes. Cord ph was not sent the umbilical cord was clamped and cut cord blood was not obtained for evaluation. The placenta was removed Intact and appeared normal. The uterine outline, tubes and ovaries appeared normal. The uterine incision was closed with running locked sutures of 0 Vicryl.  A second layer of the same suture was thrown in an imbricating fashion.  Hemostasis was assured.  The uterus was returned to the abdomen and the paracolic gutters were cleared of all clots and debris.  The rectus muscles were inspected and found to be hemostatic. ? ?The On-Q catheter pumps were inserted in accordance with the manufacturer's recommendations.  The catheters were inserted approximately 4cm cephelad to the incision line, approximately 1cm apart, straddling the midline.  They were inserted to a depth of the 4th mark. They were positioned superficial to the rectus abdominus muscles and deep to the rectus fascia.   ? ?The fascia was then reapproximated with running sutures of 1-0 PDS, looped. The subcuticular closure was performed using 4-0 monocryl. The skin closure was reinforced using surgical skin glue. ? ?The On-Q catheters were bolused with 5 mL of 0.5% marcaine plain for a total of 10 mL.  The catheters were affixed to the skin with surgical skin glue, steri-strips, and tegaderm.   ? ?The surgical  assistant performed tissue retraction, assistance with suturing, and fundal pressure. ? ?Instrument, sponge, and needle counts were correct prior the abdominal closure and were correct at the  conclusion of the case.  The patient received Ancef 2 gram IV prior to skin incision (within 30 minutes). For VTE prophylaxis she was wearing SCDs throughout the case.  The assistant surgeon was an CNM due to lack of availability of another Sales promotion account executive.  ? ?Signed: ?Conard Novak, MD ?12/30/2021 8:45 PM  ? ? ?  ?

## 2021-12-30 NOTE — Progress Notes (Signed)
[facility] Department ?Maternal Health Clinic ? ?PRENATAL VISIT NOTE ? ?Subjective:  ?Maureen Harris is a 31 y.o. G3P1011 at [redacted]w[redacted]d being seen today for ongoing prenatal care.  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 ? ?Patient reports  lightheaded and dizzy today .  Contractions: Not present. Vag. Bleeding: None.  Movement: Present. Denies leaking of fluid/ROM.  ? ?The following portions of the patient's history were reviewed and updated as appropriate: allergies, current medications, past family history, past medical history, past social history, past surgical history and problem list. Problem list updated. ? ?Objective:  ? ?Vitals:  ? 12/30/21 1052  ?BP: (!) 156/89  ?Pulse: 65  ?Temp: 97.8 ?F (36.6 ?C)  ?Weight: 158 lb (71.7 kg)  ? ? ?Fetal Status: Fetal Heart Rate (bpm): 130 Fundal Height: 40 cm Movement: Present  Presentation: Vertex ? ?General:  Alert, oriented and cooperative. Patient is in no acute distress.  ?Skin: Skin is warm and dry. No rash noted.   ?Cardiovascular: Normal heart rate noted  ?Respiratory: Normal respiratory effort, no problems with respiration noted  ?Abdomen: Soft, gravid, appropriate for gestational age.  Pain/Pressure: Absent     ?Pelvic: Cervical exam deferred        ?Extremities: Normal range of motion.  Edema: Moderate pitting, indentation subsides rapidly  ?Mental Status: Normal mood and affect. Normal behavior. Normal judgment and thought content.  ? ?Assessment  and Plan:  ?Pregnancy: G3P1011 at [redacted]w[redacted]d ? ?1. Prenatal care, subsequent pregnancy, second trimester ?BP 156/89 today, 2+ edema LE, c/o lightheaded and dizzy today, 1+ proteinuria, denies h/a, scotoma, epigastric pain ?RTC 01/01/22 for BP check ?36 wk cultures neg ?Not working since 12/20/21 ?Walking 3-4x/wk x 30 min ?S>D last u/s 11/07/21 ?To L&D now for evaluation preeclampsia--report given to [facility] on L&D and Dr. Glennon Mac ?- Hemoglobin, venipuncture ?- Urinalysis (Urine Dip) ?- Protein / creatinine ratio, urine  (Spot) ? ?2. Anemia affecting pregnancy in third trimester ?Taking FeSo4 I daily with juice ?- Hemoglobin, venipuncture ? ?3. Previous cesarean section 05/03/13 ?Has repeat c/s 01/07/22 ? ?4. Low birth weight infant 5#7 at term 05/03/13 ? ? ?5. Late prenatal care 14 wks ? ? ? ?Preterm labor symptoms and general obstetric precautions including but not limited to vaginal bleeding, contractions, leaking of fluid and fetal movement were reviewed in detail with the patient. ?Please refer to After Visit Summary for other counseling recommendations.  ?Return in about 2 days (around 01/01/2022) for BP check, routine PNC. ? ?Future Appointments  ?Date Time Provider [place]  ?12/31/2021 10:30 AM [facility] None  ?01/05/2022  8:15 AM [facility] None  ? ? ?Herbie Saxon, CNM ? ?

## 2021-12-30 NOTE — H&P (Signed)
OB History & Physical  ? ?History of Present Illness:  ?Chief Complaint:  ? ?HPI:  ?Maureen Harris is a 31 y.o. G80P1011 female at [redacted]w[redacted]d.  She presents to L&D for evaluation of HTN in at the [facility]. ? ?Active FM ?No contractions ?No bloody show ?  ?Pregnancy Issues: ?1. Previous cesarean section ?2. Low-lying placenta (resolved, currently 7cm from internal os per 11/07/21 [place]) ?3. Anemia ?4. 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 Late to prenatal care (14 wks) ? ? ?Maternal Medical History:  ? ?Past Medical History:  ?Diagnosis Date  ? Anemia affecting pregnancy in third trimester 10/22/2021  ? ? ?Past Surgical History:  ?Procedure Laterality Date  ? CESAREAN SECTION  2014  ? ? ?No Known Allergies ? ?Prior to Admission medications   ?Medication Sig Start Date End Date Taking? Authorizing Provider  ?Iron, Ferrous Sulfate, 325 (65 Fe) MG TABS Take 1 tablet by mouth daily at 6 (six) AM. 10/22/21   Caren Macadam, MD  ?Prenatal Vit-Fe Fumarate-FA (MULTIVITAMIN-PRENATAL) 27-0.8 MG TABS tablet Take 1 tablet by mouth daily at 12 noon.    [provider]  ? ? ?Prenatal care site: [facility]  ? ?Social History: She  reports that she has never smoked. She has never been exposed to tobacco smoke. She has never used smokeless tobacco. She reports that she does not currently use alcohol. She reports that she does not use drugs. ? ?Family History: family history includes Hypertension in her mother.  ? ?Review of Systems: A full review of systems was performed and negative except as noted in the HPI.   ? ? ?Physical Exam:  ?Vital Signs: BP (!) 152/94   Pulse 66   Resp 16   LMP 04/09/2021 (Exact Date)  ?General: no acute distress.  ?HEENT: normocephalic, atraumatic ?Heart: regular rate & rhythm.  No murmurs/rubs/gallops ?Lungs: clear to auscultation bilaterally, normal respiratory effort ?Abdomen: soft, gravid, non-tender;  EFW: 6.5lb ?Pelvic:  ? External: Normal external female  genitalia ? Cervix:  deferred ?  ?Extremities: non-tender, symmetric, no edema bilaterally.  DTRs: +2  ?Neurologic: Alert & oriented x 3.   ? ?Results for orders placed or performed during the hospital encounter of 12/30/21 (from the past 24 hour(s))  ?Protein / creatinine ratio, urine     Status: Abnormal  ? Collection Time: 12/30/21 12:44 PM  ?Result Value Ref Range  ? Creatinine, Urine 66 mg/dL  ? Total Protein, Urine 24 mg/dL  ? Protein Creatinine Ratio 0.36 (H) 0.00 - 0.15 mg/mg[Cre]  ?Comprehensive metabolic panel     Status: Abnormal  ? Collection Time: 12/30/21 12:49 PM  ?Result Value Ref Range  ? Sodium 134 (L) 135 - 145 mmol/L  ? Potassium 3.9 3.5 - 5.1 mmol/L  ? Chloride 106 98 - 111 mmol/L  ? CO2 20 (L) 22 - 32 mmol/L  ? Glucose, Bld 93 70 - 99 mg/dL  ? BUN 12 6 - 20 mg/dL  ? Creatinine, Ser 0.62 0.44 - 1.00 mg/dL  ? Calcium 8.4 (L) 8.9 - 10.3 mg/dL  ? Total Protein 6.6 6.5 - 8.1 g/dL  ? Albumin 2.7 (L) 3.5 - 5.0 g/dL  ? AST 39 15 - 41 U/L  ? ALT 40 0 - 44 U/L  ? Alkaline Phosphatase 163 (H) 38 - 126 U/L  ? Total Bilirubin 0.8 0.3 - 1.2 mg/dL  ? GFR, Estimated >60 >60 mL/min  ? Anion gap 8 5 - 15  ?CBC  Status: Abnormal  ? Collection Time: 12/30/21 12:49 PM  ?Result Value Ref Range  ? WBC 4.4 4.0 - 10.5 K/uL  ? RBC 3.69 (L) 3.87 - 5.11 MIL/uL  ? Hemoglobin 10.2 (L) 12.0 - 15.0 g/dL  ? HCT 31.6 (L) 36.0 - 46.0 %  ? MCV 85.6 80.0 - 100.0 fL  ? MCH 27.6 26.0 - 34.0 pg  ? MCHC 32.3 30.0 - 36.0 g/dL  ? RDW 13.4 11.5 - 15.5 %  ? Platelets 174 150 - 400 K/uL  ? nRBC 0.0 0.0 - 0.2 %  ? ? ?Pertinent Results:  ?Prenatal Labs: ?Blood type/Rh B positive  ?Antibody screen neg  ?Rubella Immune  ?Varicella Immune  ?RPR NR  ?HBsAg Neg  ?HIV NR  ?GC neg  ?Chlamydia neg  ?Genetic screening negative  ?1 hour GTT 109  ?3 hour GTT N/a  ?GBS negative  ? ?FHT: 145bpm, moderate variability, accels present, no decelerations ?TOCO: occasional ?SVE:  deferred ?  ?Cephalic by leopolds ? ?No results found. ? ?Assessment:  ?Maureen Harris is a 31 y.o. G60P1011 female at [redacted]w[redacted]d with pre-eclampsia.  ? ?Plan:  ?1. Admit to Labor & Delivery; consents reviewed and obtained ? ?2. Fetal Well being  ?- Fetal Tracing: Cat I ?- Group B Streptococcus ppx indicated: GBS negative ?- Presentation: vertex confirmed by Leopolds  ? ?3. Routine OB: ?- Prenatal labs reviewed, as above ?- Rh pos ?- CBC, T&S, RPR on admit ?- NPO, IVF ? ?4. Repeat cesarean section with Dr. Glennon Mac ?-  Repeat cesarean section, first cesarean section 2014 for [facility] ?- Pre-eclampsia without severe features ? ?5. Post Partum Planning: ?- Infant feeding: TBD ?- Contraception: TBD ? ?Gertie Fey, CNM ?12/30/21 ?3:22 PM ? ? ? ? ?

## 2021-12-30 NOTE — Progress Notes (Addendum)
Here today for 37.6 week MH RV. Taking PNV and Iron QD. Denies ED/hospital visits since last RV. Has repeat C-Section scheduled for 01/07/22. BP today 156/89. States is "feeling lightheaded." Sent to lab for UA and Hgb recheck. Tawny Hopping, RN ? ?

## 2021-12-30 NOTE — Anesthesia Preprocedure Evaluation (Signed)
Anesthesia Evaluation  ?Patient identified by MRN, date of birth, ID band ?Patient awake ? ? ? ?Reviewed: ?Allergy & Precautions, H&P , NPO status , Patient's Chart, lab work & pertinent test results, reviewed documented beta blocker date and time  ? ?Airway ?Mallampati: II ? ?TM Distance: >3 FB ?Neck ROM: full ? ? ? Dental ?no notable dental hx. ?(+) Teeth Intact ?  ?Pulmonary ? ?  ?Pulmonary exam normal ?breath sounds clear to auscultation ? ? ? ? ? ? Cardiovascular ?Exercise Tolerance: Good ?hypertension,  ?Rhythm:regular Rate:Normal ? ? ?  ?Neuro/Psych ?negative neurological ROS ? negative psych ROS  ? GI/Hepatic ?negative GI ROS, Neg liver ROS,   ?Endo/Other  ?negative endocrine ROSdiabetes, Gestational ? Renal/GU ?negative Renal ROS  ?negative genitourinary ?  ?Musculoskeletal ? ? Abdominal ?  ?Peds ? Hematology ?negative hematology ROS ?(+)   ?Anesthesia Other Findings ? ? Reproductive/Obstetrics ?(+) Pregnancy ? ?  ? ? ? ? ? ? ? ? ? ? ? ? ? ?  ?  ? ? ? ? ? ? ? ? ?Anesthesia Physical ?Anesthesia Plan ? ?ASA: 2 and emergent ? ?Anesthesia Plan: Spinal  ? ?Post-op Pain Management:   ? ?Induction:  ? ?PONV Risk Score and Plan: 3 ? ?Airway Management Planned:  ? ?Additional Equipment:  ? ?Intra-op Plan:  ? ?Post-operative Plan:  ? ?Informed Consent: I have reviewed the patients History and Physical, chart, labs and discussed the procedure including the risks, benefits and alternatives for the proposed anesthesia with the patient or authorized representative who has indicated his/her understanding and acceptance.  ? ? ? ? ? ?Plan Discussed with:  ? ?Anesthesia Plan Comments: (In discussion with Obstetrician, pt with previous section and elevated pressures, will proceed with repeat C/S and SAB. JA)  ? ? ? ? ? ? ?Anesthesia Quick Evaluation ? ?

## 2021-12-30 NOTE — Transfer of Care (Signed)
Immediate Anesthesia Transfer of Care Note ? ?Patient: Maureen Harris ? ?Procedure(s) Performed: REPEAT CESAREAN SECTION ? ?Patient Location: PACU ? ?Anesthesia Type:Spinal ? ?Level of Consciousness: awake and patient cooperative ? ?Airway & Oxygen Therapy: Patient Spontanous Breathing and Patient connected to nasal cannula oxygen ? ?Post-op Assessment: Report given to RN and Post -op Vital signs reviewed and stable ? ?Post vital signs: Reviewed and stable ? ?Last Vitals:  ?Vitals Value Taken Time  ?BP    ?Temp    ?Pulse 73 12/30/21 2107  ?Resp    ?SpO2 99 % 12/30/21 2107  ?Vitals shown include unvalidated device data. ? ?Last Pain:  ?Vitals:  ? 12/30/21 1241  ?TempSrc: Oral  ?PainSc: 0-No pain  ?   ? ?  ? ?Complications: No notable events documented. ?

## 2021-12-30 NOTE — Progress Notes (Signed)
Patient presents after having an elevated BP in clinic. She denies severe symptoms. Her BPs have ruled her in for preeclampsia (+proteinuria).  Given her gestational age, delivery is indicated. She previously has been scheduled for repeat c-section due to her history of prior. She has been extensively counseled regarding the TOLAC for ERCS.  She again voices decision to have ERCS.  ?I personally consented her for the surgery and answered all her questions. ? ?Thomasene Mohair, MD, FACOG ?[facility] ?12/30/2021 7:11 PM   ?

## 2021-12-30 NOTE — Progress Notes (Signed)
S: ?Notified by RN that pt's Bps have been 160s-180s/100s since leaving the OR. Pt denies headache, changes of vision, or RUQ pain. ? ?O: ?Vitals:  ? 12/30/21 1416 12/30/21 1431 12/30/21 1446 12/30/21 1501  ?BP: 128/89 (!) 152/94 (!) 135/98 (!) 133/96  ? 12/30/21 1531 12/30/21 1601 12/30/21 1630 12/30/21 1657  ?BP: (!) 138/94 (!) 152/93 (!) 168/95 135/89  ? 12/30/21 1701 12/30/21 1834 12/30/21 2109 12/30/21 2115  ?BP: (!) 154/96 139/87 (!) 183/103 (!) 187/107  ? ?CBC ?   ?Component Value Date/Time  ? WBC 4.4 12/30/2021 1249  ? RBC 3.69 (L) 12/30/2021 1249  ? HGB 10.2 (L) 12/30/2021 1249  ? HGB 10.2 (L) 10/22/2021 1515  ? HCT 31.6 (L) 12/30/2021 1249  ? HCT 30.2 (L) 10/22/2021 1515  ? PLT 174 12/30/2021 1249  ? PLT 249 10/22/2021 1515  ? MCV 85.6 12/30/2021 1249  ? MCV 88 10/22/2021 1515  ? MCH 27.6 12/30/2021 1249  ? MCHC 32.3 12/30/2021 1249  ? RDW 13.4 12/30/2021 1249  ? RDW 11.1 (L) 10/22/2021 1515  ? LYMPHSABS 0.9 10/22/2021 1515  ? EOSABS 0.1 10/22/2021 1515  ? BASOSABS 0.0 10/22/2021 1515  ? ? ?  Latest Ref Rng & Units 12/30/2021  ? 12:49 PM  ?CMP  ?Glucose 70 - 99 mg/dL 93    ?BUN 6 - 20 mg/dL 12    ?Creatinine 0.44 - 1.00 mg/dL 0.62    ?Sodium 135 - 145 mmol/L 134    ?Potassium 3.5 - 5.1 mmol/L 3.9    ?Chloride 98 - 111 mmol/L 106    ?CO2 22 - 32 mmol/L 20    ?Calcium 8.9 - 10.3 mg/dL 8.4    ?Total Protein 6.5 - 8.1 g/dL 6.6    ?Total Bilirubin 0.3 - 1.2 mg/dL 0.8    ?Alkaline Phos 38 - 126 U/L 163    ?AST 15 - 41 U/L 39    ?ALT 0 - 44 U/L 40    ? ?Protein/creatinine ratio, urine: 0.36 ? ?A: ?Pre-eclampsia with severe features ? ?P:  ?Discussed pt's blood pressures with Dr. Glennon Mac. Giving IV labetalol for severe range blood pressures and starting IV magnesium for seizure prophylaxis. ? ?Gertie Fey, CNM ?12/30/2021 ?9:30 PM ? ?

## 2021-12-30 NOTE — Anesthesia Procedure Notes (Signed)
Spinal ? ?Patient location during procedure: OR ?End time: 12/30/2021 7:23 PM ?Reason for block: surgical anesthesia ?Preanesthetic Checklist ?Completed: patient identified, IV checked, site marked, risks and benefits discussed, surgical consent, monitors and equipment checked, pre-op evaluation and timeout performed ?Spinal Block ?Patient position: sitting ?Prep: Betadine ?Patient monitoring: heart rate, cardiac monitor, continuous pulse ox and blood pressure ?Approach: midline ?Location: L4-5 ?Injection technique: single-shot ?Needle ?Needle type: Sprotte  ?Needle gauge: 24 G ?Needle length: 9 cm ?Assessment ?Sensory level: T4 ?Events: CSF return ?Additional Notes ?Tolerated well. One pass. Clear scf. Vsst. ja ? ? ? ?

## 2021-12-31 ENCOUNTER — Encounter: Payer: Self-pay | Admitting: Obstetrics and Gynecology

## 2021-12-31 ENCOUNTER — Encounter: Payer: Medicaid Other | Attending: Obstetrics and Gynecology

## 2021-12-31 LAB — COMPREHENSIVE METABOLIC PANEL
ALT: 37 U/L (ref 0–44)
AST: 47 U/L — ABNORMAL HIGH (ref 15–41)
Albumin: 2.1 g/dL — ABNORMAL LOW (ref 3.5–5.0)
Alkaline Phosphatase: 122 U/L (ref 38–126)
Anion gap: 8 (ref 5–15)
BUN: 9 mg/dL (ref 6–20)
CO2: 18 mmol/L — ABNORMAL LOW (ref 22–32)
Calcium: 6.8 mg/dL — ABNORMAL LOW (ref 8.9–10.3)
Chloride: 105 mmol/L (ref 98–111)
Creatinine, Ser: 0.76 mg/dL (ref 0.44–1.00)
GFR, Estimated: >60 mL/min (ref >60)
Glucose, Bld: 91 mg/dL (ref 70–99)
Potassium: 4.2 mmol/L (ref 3.5–5.1)
Sodium: 131 mmol/L — ABNORMAL LOW (ref 135–145)
Total Bilirubin: 0.8 mg/dL (ref 0.3–1.2)
Total Protein: 4.9 g/dL — ABNORMAL LOW (ref 6.5–8.1)

## 2021-12-31 LAB — CBC
HCT: 23.6 % — ABNORMAL LOW (ref 36.0–46.0)
Hemoglobin: 7.6 g/dL — ABNORMAL LOW (ref 12.0–15.0)
MCH: 28.1 pg (ref 26.0–34.0)
MCHC: 32.2 g/dL (ref 30.0–36.0)
MCV: 87.4 fL (ref 80.0–100.0)
Platelets: 147 10*3/uL — ABNORMAL LOW (ref 150–400)
RBC: 2.7 MIL/uL — ABNORMAL LOW (ref 3.87–5.11)
RDW: 13.6 % (ref 11.5–15.5)
WBC: 10.7 10*3/uL — ABNORMAL HIGH (ref 4.0–10.5)
nRBC: 0 % (ref 0.0–0.2)

## 2021-12-31 LAB — RPR: RPR Ser Ql: NONREACTIVE

## 2021-12-31 MED ORDER — LACTATED RINGERS IV BOLUS
500.0000 mL | Freq: Once | INTRAVENOUS | Status: AC
  Administered 2021-12-31: 500 mL via INTRAVENOUS

## 2021-12-31 MED ORDER — MEPERIDINE HCL 25 MG/ML IJ SOLN: 6.2500 mg | INTRAMUSCULAR | Status: DC | PRN

## 2021-12-31 MED ORDER — NIFEDIPINE ER OSMOTIC RELEASE 30 MG PO TB24
30.0000 mg | ORAL_TABLET | Freq: Every day | ORAL | Status: DC
  Administered 2021-12-31 – 2022-01-01 (×2): 30 mg via ORAL
  Filled 2021-12-31 (×2): qty 1

## 2021-12-31 MED ORDER — FERROUS SULFATE 325 (65 FE) MG PO TABS
325.0000 mg | ORAL_TABLET | Freq: Two times a day (BID) | ORAL | Status: DC
  Administered 2021-12-31 – 2022-01-02 (×5): 325 mg via ORAL
  Filled 2021-12-31 (×5): qty 1

## 2021-12-31 MED ORDER — SENNOSIDES-DOCUSATE SODIUM 8.6-50 MG PO TABS
2.0000 | ORAL_TABLET | ORAL | Status: DC
  Administered 2022-01-01 – 2022-01-02 (×2): 2 via ORAL
  Filled 2021-12-31 (×2): qty 2

## 2021-12-31 MED ORDER — MENTHOL 3 MG MT LOZG
1.0000 | LOZENGE | OROMUCOSAL | Status: DC | PRN
  Filled 2021-12-31: qty 9

## 2021-12-31 MED ORDER — DIBUCAINE (PERIANAL) 1 % EX OINT: 1.0000 "application " | TOPICAL_OINTMENT | CUTANEOUS | Status: DC | PRN

## 2021-12-31 MED ORDER — LACTATED RINGERS IV SOLN: INTRAVENOUS | Status: DC

## 2021-12-31 MED ORDER — SODIUM CHLORIDE 0.9% FLUSH: 3.0000 mL | INTRAVENOUS | Status: DC | PRN

## 2021-12-31 MED ORDER — PRENATAL MULTIVITAMIN CH
1.0000 | ORAL_TABLET | Freq: Every day | ORAL | Status: DC
  Administered 2021-12-31 – 2022-01-01 (×2): 1 via ORAL
  Filled 2021-12-31 (×2): qty 1

## 2021-12-31 MED ORDER — WITCH HAZEL-GLYCERIN EX PADS
1.0000 "application " | MEDICATED_PAD | CUTANEOUS | Status: DC | PRN
  Filled 2021-12-31: qty 100

## 2021-12-31 MED ORDER — DIPHENHYDRAMINE HCL 25 MG PO CAPS: 25.0000 mg | ORAL_CAPSULE | Freq: Four times a day (QID) | ORAL | Status: DC | PRN

## 2021-12-31 MED ORDER — SODIUM CHLORIDE 0.9 % IV SOLN
300.0000 mg | Freq: Once | INTRAVENOUS | Status: AC
  Administered 2021-12-31: 300 mg via INTRAVENOUS
  Filled 2021-12-31: qty 300

## 2021-12-31 MED ORDER — DIPHENHYDRAMINE HCL 25 MG PO CAPS: 25.0000 mg | ORAL_CAPSULE | ORAL | Status: DC | PRN

## 2021-12-31 MED ORDER — NALOXONE HCL 4 MG/10ML IJ SOLN
1.0000 ug/kg/h | INTRAVENOUS | Status: DC | PRN
  Filled 2021-12-31: qty 5

## 2021-12-31 MED ORDER — COCONUT OIL OIL: 1.0000 "application " | TOPICAL_OIL | Status: DC | PRN

## 2021-12-31 MED ORDER — SIMETHICONE 80 MG PO CHEW
80.0000 mg | CHEWABLE_TABLET | Freq: Three times a day (TID) | ORAL | Status: DC
  Administered 2021-12-31 – 2022-01-02 (×7): 80 mg via ORAL
  Filled 2021-12-31 (×7): qty 1

## 2021-12-31 MED ORDER — NALOXONE HCL 0.4 MG/ML IJ SOLN: 0.4000 mg | INTRAMUSCULAR | Status: DC | PRN

## 2021-12-31 MED ORDER — DIPHENHYDRAMINE HCL 50 MG/ML IJ SOLN
12.5000 mg | INTRAMUSCULAR | Status: DC | PRN
Start: 2021-12-31 — End: 2022-01-02

## 2021-12-31 MED ORDER — SODIUM CHLORIDE 0.9 % IV SOLN
12.5000 mg | Freq: Four times a day (QID) | INTRAVENOUS | Status: DC | PRN
  Administered 2021-12-31: 12.5 mg via INTRAVENOUS
  Filled 2021-12-31: qty 12.5

## 2021-12-31 NOTE — Progress Notes (Signed)
Post Partum Day 1 ?Subjective: ?Doing well, no complaints.  Tolerating pain with PO meds ? ?No CP SOB Fever,Chills, N/V or leg pain; denies nipple or breast pain ?no HA change of vision, RUQ/epigastric pain ? ?Objective: ?BP 121/74 (BP Location: Left Arm)   Pulse 80   Temp 97.7 ?F (36.5 ?C) (Oral)   Resp 16   Ht 5\' 4"  (1.626 m)   Wt 71.7 kg   LMP 04/09/2021 (Exact Date)   SpO2 100%   Breastfeeding Unknown   BMI 27.13 kg/m?  ? ?Vitals:  ? 12/30/21 2300 12/30/21 2330 12/31/21 0000 12/31/21 0045  ?BP: (!) 124/95 (!) 130/96 (!) 142/99 129/87  ? 12/31/21 0100 12/31/21 0200 12/31/21 0300 12/31/21 0400  ?BP: (!) 124/92 119/74 111/65 113/69  ? 12/31/21 0500 12/31/21 0600 12/31/21 0700 12/31/21 0800  ?BP: 102/69 100/62 113/61 121/74  ? ?Total I/O ?In: 330.1 [P.O.:80; I.V.:250.1] ?Out: 110 [Urine:110] ? ?  ?Physical Exam:  ?General: NAD ?Breasts: soft/nontender- planning to pump ?CV: RRR ?Pulm: nl effort, CTABL ?Abdomen: soft, NT, BS x 4 ?Incision: honeycomb Dsg CDI- no erythema or drainage; OnQ pump intact.  ?Lochia: scant ?Uterine Fundus: fundus firm and 1 fb below umbilicus ?DVT Evaluation: no cords, ttp LEs  ? ?Recent Labs  ?  12/30/21 ?1249 12/31/21 ?0629  ?HGB 10.2* 7.6*  ?HCT 31.6* 23.6*  ?WBC 4.4 10.7*  ?PLT 174 147*  ? ? ?Assessment/Plan: ?31 y.o. 26 postpartum day # 1 ? ?- Continue routine PP care, encouraged OOB with assist.  ?- Lactation consult prn ?- Continue Mag Sulfate at 2gm/hr, BP remain normal to mild range, pt asymptomatic. Foley cath in place- urine output adequate but minimal.   ?- Acute blood loss anemia - hemodynamically stable and asymptomatic; start po ferrous sulfate BID with stool softeners, iron infusion ordered.   ? ? ? ? ?Disposition: Does not desire Dc home today.  ? ? ? ?[id number], CNM ?12/31/2021  ?9:02 AM ? ? ? ? ?

## 2021-12-31 NOTE — Progress Notes (Signed)
Post Partum Day 1 ?Subjective: ?Doing well, no complaints.  Tolerating pain with PO meds ? ?No CP SOB Fever,Chills, N/V or leg pain; denies nipple or breast pain ?no HA change of vision, RUQ/epigastric pain ? ?Objective: ?BP (!) 149/88   Pulse (!) 110   Temp 98.9 ?F (37.2 ?C) (Oral)   Resp 18   Ht 5\' 4"  (1.626 m)   Wt 71.7 kg   LMP 04/09/2021 (Exact Date)   SpO2 97%   Breastfeeding Unknown   BMI 27.13 kg/m?  ? ?Vitals:  ? 12/31/21 0900 12/31/21 0958 12/31/21 1100 12/31/21 1200  ?BP: 124/81 115/70 (!) 116/54 136/82  ? 12/31/21 1300 12/31/21 1358 12/31/21 1500 12/31/21 1600  ?BP: 126/86 128/81 134/79 119/65  ? 12/31/21 1700 12/31/21 1800 12/31/21 1900 12/31/21 2100  ?BP: 127/82 122/76 120/78 (!) 149/88  ? ?I/O last 3 completed shifts: ?In: 6226.9 [P.O.:1620; I.V.:3131.3; IV Piggyback:1475.7] ?Out: [id number]; Blood:1140] ?Total I/O ?In: 125.1 [I.V.:125.1] ?Out: 250 [Urine:250] ? ? ? ?  ?Physical Exam:  ?General: NAD ?Breasts: soft/nontender- planning to pump ?CV: RRR ?Pulm: nl effort, CTABL ?Abdomen: soft, NT, BS x 4 ?Incision: honeycomb Dsg CDI- no erythema or drainage; OnQ pump intact.  ?Lochia: scant ?Uterine Fundus: fundus firm and 1 fb below umbilicus ?DVT Evaluation: no cords, ttp LEs  ? ?Recent Labs  ?  12/30/21 ?1249 12/31/21 ?0629  ?HGB 10.2* 7.6*  ?HCT 31.6* 23.6*  ?WBC 4.4 10.7*  ?PLT 174 147*  ? ?Component ?    Latest Ref Rng 12/30/2021 12/31/2021  ?Sodium ?    135 - 145 mmol/L 134 (L)  131 (L)   ?Potassium ?    3.5 - 5.1 mmol/L 3.9  4.2   ?Chloride ?    98 - 111 mmol/L 106  105   ?CO2 ?    22 - 32 mmol/L 20 (L)  18 (L)   ?Glucose ?    70 - 99 mg/dL 93  91   ?BUN ?    6 - 20 mg/dL 12  9   ?Creatinine ?    0.44 - 1.00 mg/dL 0.62  0.76   ?Calcium ?    8.9 - 10.3 mg/dL 8.4 (L)  6.8 (L)   ?Total Protein ?    6.5 - 8.1 g/dL 6.6  4.9 (L)   ?Albumin ?    3.5 - 5.0 g/dL 2.7 (L)  2.1 (L)   ?AST ?    15 - 41 U/L 39  47 (H)   ?ALT ?    0 - 44 U/L 40  37   ?Alkaline Phosphatase ?    38 - 126 U/L 163 (H)   122   ?Total Bilirubin ?    0.3 - 1.2 mg/dL 0.8  0.8   ?GFR, Estimated ?    >60 mL/min >60  >60   ?Anion gap ?    5 - 15  8  8    ?  ?(L) Low ?(H) High ? ?Assessment/Plan: ?31 y.o. [id number] postpartum day # 1 ? ?- Continue routine PP care ?- Lactation consult prn ?- Mag DC now, foley DC now. plan to continue strict I/O,  encouraged to ambulate.   ?- Procardia 30mg  XL start now, mild range BP.  ?- Labs repeat in AM.  ? ? ? ?Disposition: Does not desire Dc home today.  ? ? ? ?Francetta Found, CNM ?12/31/2021  ?10:01 PM ? ? ? ? ?

## 2021-12-31 NOTE — Anesthesia Post-op Follow-up Note (Signed)
?  Anesthesia Pain Follow-up Note ? ?Patient: Maureen Harris ? ?Day #: 1 ? ?Date of Follow-up: 12/31/2021 Time: 8:39 AM ? ?Last Vitals:  ?Vitals:  ? 12/31/21 0600 12/31/21 0700  ?BP: 100/62 113/61  ?Pulse: 75 71  ?Resp: 14   ?Temp: 36.6 ?C   ?SpO2: 99% 99%  ? ? ?Level of Consciousness: alert ? ?Pain: none  ? ?Side Effects:None ? ?Catheter Site Exam:clean, dry, no drainage ? ? ? ? ?Plan: D/C from anesthesia care at surgeon's request ? ?Malachi Pro C ? ? ? ?

## 2021-12-31 NOTE — Anesthesia Postprocedure Evaluation (Signed)
Anesthesia Post Note ? ?Patient: Maureen Harris ? ?Procedure(s) Performed: REPEAT CESAREAN SECTION ? ?Patient location during evaluation: Mother Baby ?Anesthesia Type: Spinal ?Level of consciousness: oriented and awake and alert ?Pain management: pain level controlled ?Vital Signs Assessment: post-procedure vital signs reviewed and stable ?Respiratory status: spontaneous breathing and respiratory function stable ?Cardiovascular status: blood pressure returned to baseline and stable ?Postop Assessment: no headache, no backache, no apparent nausea or vomiting and able to ambulate ?Anesthetic complications: no ? ? ?No notable events documented. ? ? ?Last Vitals:  ?Vitals:  ? 12/31/21 0600 12/31/21 0700  ?BP: 100/62 113/61  ?Pulse: 75 71  ?Resp: 14   ?Temp: 36.6 ?C   ?SpO2: 99% 99%  ?  ?Last Pain:  ?Vitals:  ? 12/31/21 0630  ?TempSrc:   ?PainSc: 4   ? ? ?  ?  ?  ?  ?  ?  ? ?Malachi Pro C ? ? ? ? ?

## 2022-01-01 ENCOUNTER — Ambulatory Visit: Payer: Medicaid Other

## 2022-01-01 LAB — COMPREHENSIVE METABOLIC PANEL
ALT: 34 U/L (ref 0–44)
AST: 36 U/L (ref 15–41)
Albumin: 2.4 g/dL — ABNORMAL LOW (ref 3.5–5.0)
Alkaline Phosphatase: 120 U/L (ref 38–126)
Anion gap: 6 (ref 5–15)
BUN: 6 mg/dL (ref 6–20)
CO2: 21 mmol/L — ABNORMAL LOW (ref 22–32)
Calcium: 6.8 mg/dL — ABNORMAL LOW (ref 8.9–10.3)
Chloride: 109 mmol/L (ref 98–111)
Creatinine, Ser: 0.68 mg/dL (ref 0.44–1.00)
GFR, Estimated: >60 mL/min (ref >60)
Glucose, Bld: 94 mg/dL (ref 70–99)
Potassium: 4.1 mmol/L (ref 3.5–5.1)
Sodium: 136 mmol/L (ref 135–145)
Total Bilirubin: 0.6 mg/dL (ref 0.3–1.2)
Total Protein: 5.6 g/dL — ABNORMAL LOW (ref 6.5–8.1)

## 2022-01-01 LAB — CBC
HCT: 21 % — ABNORMAL LOW (ref 36.0–46.0)
Hemoglobin: 6.9 g/dL — ABNORMAL LOW (ref 12.0–15.0)
MCH: 28.4 pg (ref 26.0–34.0)
MCHC: 32.9 g/dL (ref 30.0–36.0)
MCV: 86.4 fL (ref 80.0–100.0)
Platelets: 213 10*3/uL (ref 150–400)
RBC: 2.43 MIL/uL — ABNORMAL LOW (ref 3.87–5.11)
RDW: 13.8 % (ref 11.5–15.5)
WBC: 11.7 10*3/uL — ABNORMAL HIGH (ref 4.0–10.5)
nRBC: 0 % (ref 0.0–0.2)

## 2022-01-01 LAB — PREPARE RBC (CROSSMATCH)

## 2022-01-01 MED ORDER — SODIUM CHLORIDE 0.9% IV SOLUTION: Freq: Once | INTRAVENOUS | Status: AC

## 2022-01-01 MED ORDER — ACETAMINOPHEN 325 MG PO TABS
650.0000 mg | ORAL_TABLET | Freq: Once | ORAL | Status: AC
Start: 2022-01-01 — End: 2022-01-01
  Administered 2022-01-01: 650 mg via ORAL
  Filled 2022-01-01: qty 2

## 2022-01-01 MED ORDER — DIPHENHYDRAMINE HCL 25 MG PO CAPS
25.0000 mg | ORAL_CAPSULE | Freq: Once | ORAL | Status: AC
  Administered 2022-01-01: 25 mg via ORAL
  Filled 2022-01-01: qty 1

## 2022-01-01 MED ORDER — NIFEDIPINE ER OSMOTIC RELEASE 30 MG PO TB24
60.0000 mg | ORAL_TABLET | Freq: Every day | ORAL | Status: DC
  Administered 2022-01-02: 60 mg via ORAL
  Filled 2022-01-01: qty 2

## 2022-01-01 MED ORDER — NIFEDIPINE ER OSMOTIC RELEASE 30 MG PO TB24
30.0000 mg | ORAL_TABLET | Freq: Once | ORAL | Status: AC
  Administered 2022-01-01: 30 mg via ORAL
  Filled 2022-01-01: qty 1

## 2022-01-01 MED ORDER — SODIUM CHLORIDE 0.9 % IV BOLUS
500.0000 mL | Freq: Once | INTRAVENOUS | Status: AC
  Administered 2022-01-01: 500 mL via INTRAVENOUS

## 2022-01-01 NOTE — Progress Notes (Signed)
Postop Day  2 ? ?Subjective: ?up ad lib, voiding, tolerating PO, and reports being tired, gas pain and some uncontrolled pain with movement. ? ?Doing well, no concerns. Ambulating without difficulty, pain managed with PO meds, tolerating regular diet, and voiding without difficulty.  ? ?No fever/chills, chest pain, shortness of breath, nausea/vomiting, or leg pain. No nipple or breast pain. No headache, visual changes, or RUQ/epigastric pain. ? ?Objective: ?BP 126/75 (BP Location: Right Arm)   Pulse 98   Temp 98.8 ?F (37.1 ?C) (Oral)   Resp 18   Ht 5\' 4"  (1.626 m)   Wt 71.7 kg   LMP 04/09/2021 (Exact Date)   SpO2 97%   Breastfeeding Unknown   BMI 27.13 kg/m?  ?  ?Physical Exam:  ?General: alert, cooperative, and appears stated age ?Breasts: soft/nontender ?CV: RRR ?Pulm: nl effort, CTABL ?Abdomen: soft, non-tender, active bowel sounds ?Uterine Fundus: firm ?Incision: healing well, no significant drainage, no dehiscence, no significant erythema, honeycomb drsg intact, with OnQ pump ?Perineum: minimal edema, intact ?Lochia: appropriate ?DVT Evaluation: No evidence of DVT seen on physical exam. ?Negative Homan's sign. ?No cords or calf tenderness. ?No significant calf/ankle edema. ? ?Recent Labs  ?  12/31/21 ?0629 01/01/22 ?0626  ?HGB 7.6* 6.9*  ?HCT 23.6* 21.0*  ?WBC 10.7* 11.7*  ?PLT 147* 213  ? ? ?Assessment/Plan: ?31 y.o. [id number] postpartum day # 2 ? ?-Continue routine postpartum care ?-encouraged ambulation and alternation of pain medication for relief ?-Encouraged snug fitting bra, cold application, Tylenol PRN, and cabbage leaves for engorgement for formula feeding  ?-Discussed contraceptive options including implant, IUDs hormonal and non-hormonal, injection, pills/ring/patch, condoms, and NFP.  ?-Patient remains undecided on birth control at this time ?-Acute blood loss anemia - hemodynamically stable and symptomatic; start PO ferrous sulfate BID with stool softeners  ?-Patient declined blood  transfusion with Hgb 6.9. she did receive IV Venofer yesterday. ?-Patient's BP stable on Procardia 30 mg XL ?-Immunization status:   all immunizations up to date ? ? ?Disposition: Continue inpatient postpartum care  ? LOS: 2 days  ? ?----- ?Avelino Leeds  ?Certified Nurse Midwife ?[facility] ?[facility]  ?

## 2022-01-01 NOTE — Progress Notes (Signed)
Per. Chari Holstein, pt refused blood transfusion.  ?

## 2022-01-02 ENCOUNTER — Ambulatory Visit: Payer: Self-pay

## 2022-01-02 LAB — TYPE AND SCREEN
ABO/RH(D): B POS
Antibody Screen: NEGATIVE
Unit division: 0

## 2022-01-02 LAB — CBC
HCT: 25.9 % — ABNORMAL LOW (ref 36.0–46.0)
Hemoglobin: 8.4 g/dL — ABNORMAL LOW (ref 12.0–15.0)
MCH: 28.3 pg (ref 26.0–34.0)
MCHC: 32.4 g/dL (ref 30.0–36.0)
MCV: 87.2 fL (ref 80.0–100.0)
Platelets: 253 10*3/uL (ref 150–400)
RBC: 2.97 MIL/uL — ABNORMAL LOW (ref 3.87–5.11)
RDW: 14.1 % (ref 11.5–15.5)
WBC: 11.6 10*3/uL — ABNORMAL HIGH (ref 4.0–10.5)
nRBC: 0 % (ref 0.0–0.2)

## 2022-01-02 LAB — BPAM RBC
Blood Product Expiration Date: 202305022359
ISSUE DATE / TIME: 202304061928
Unit Type and Rh: 7300

## 2022-01-02 LAB — HEMOGLOBIN AND HEMATOCRIT, BLOOD
HCT: 27.3 % — ABNORMAL LOW (ref 36.0–46.0)
Hemoglobin: 8.7 g/dL — ABNORMAL LOW (ref 12.0–15.0)

## 2022-01-02 MED ORDER — IBUPROFEN 600 MG PO TABS: 600.0000 mg | ORAL_TABLET | Freq: Four times a day (QID) | ORAL | 1 refills | Status: AC | PRN

## 2022-01-02 MED ORDER — NIFEDIPINE ER 60 MG PO TB24: 60.0000 mg | ORAL_TABLET | Freq: Every day | ORAL | 3 refills | Status: AC

## 2022-01-02 MED ORDER — OXYCODONE-ACETAMINOPHEN 5-325 MG PO TABS: 1.0000 | ORAL_TABLET | ORAL | 0 refills | Status: AC | PRN

## 2022-01-02 NOTE — Progress Notes (Signed)
Pt discharged with infant.  Discharge instructions, prescriptions and follow up appointment given to and reviewed with pt. Pt verbalized understanding. Escorted out by auxillary. 

## 2022-01-02 NOTE — Lactation Note (Signed)
This note was copied from a baby's chart. ?Lactation Consultation Note ? ?Patient Name: Maureen Harris ?Today's Date: 01/02/2022 ?  ?Age:30 hours ? ?Maternal Data ?  ? ?Feeding ? Mom has been formula feeding baby since birth and now requests a breast pump, she states she plans on pumping breasts at home, she has a breast pump kit in her room and I showed her how to use the [place] pump in the kit, I informed her that she can also contact [facility] to see what they may offer for breast pumps.  ? ?LATCH Score ?  ? ?  ? ?  ? ?  ? ?  ? ?  ? ? ?Lactation Tools Discussed/Used ? [place] name and no written on white board ? ?Interventions ?  ? ?Discharge ?  ? ?Consult Status ?  ? ? ? ?[name] ?01/02/2022, 6:18 PM ? ? ? ?

## 2022-01-03 LAB — PROTEIN / CREATININE RATIO, URINE
Creatinine, Urine: 74.1 mg/dL
Protein, Ur: 27.1 mg/dL
Protein/Creat Ratio: 366 mg/g creat — ABNORMAL HIGH (ref 0–200)

## 2022-01-05 ENCOUNTER — Other Ambulatory Visit: Admission: RE | Admit: 2022-01-05 | Payer: Medicaid Other | Source: Ambulatory Visit

## 2022-01-07 ENCOUNTER — Inpatient Hospital Stay
Admission: RE | Admit: 2022-01-07 | Payer: Medicaid Other | Source: Home / Self Care | Admitting: Obstetrics and Gynecology

## 2022-01-07 DIAGNOSIS — Z3482 Encounter for supervision of other normal pregnancy, second trimester: Secondary | ICD-10-CM

## 2022-01-07 DIAGNOSIS — Z98891 History of uterine scar from previous surgery: Secondary | ICD-10-CM

## 2022-01-26 ENCOUNTER — Telehealth: Payer: Self-pay | Admitting: Family Medicine

## 2022-01-26 NOTE — Telephone Encounter (Signed)
Scheduled pt for PP appt  02/12/2022.  ?

## 2022-02-12 ENCOUNTER — Ambulatory Visit: Payer: Medicaid Other

## 2022-02-12 ENCOUNTER — Telehealth: Payer: Self-pay | Admitting: Family Medicine

## 2022-02-12 NOTE — Telephone Encounter (Signed)
RS missed PP appointment for 02/25/2022

## 2022-02-25 ENCOUNTER — Ambulatory Visit: Payer: Medicaid Other

## 2022-02-26 ENCOUNTER — Telehealth: Payer: Self-pay | Admitting: Family Medicine

## 2022-02-26 NOTE — Telephone Encounter (Signed)
MyChart message sent to patient today regarding her missed pp visit on 5-31-20203.  Last patient login 01-26-2022.  Asked patient to please call our office to reschedule her appointment.  Hart Carwin, RN

## 2022-02-26 NOTE — Telephone Encounter (Signed)
Called pt to rs missed PP appointment. Call was disconnected when trying to schedule. Not sure if pt hung up or the call was just dropped. Called right back and the call went straight to voicemail. LVM for her to call us to rs appt.

## 2022-02-27 NOTE — Telephone Encounter (Signed)
Telephone call to patient this morning regarding her missed pp visit.  Patient answered the phone and I asked to speak with the specific patient and then I introduced myself.  She verified it was her and I asked for her date of birth then either the call dropped or the patient hung up.  Hart Carwin, RN

## 2022-03-02 NOTE — Telephone Encounter (Signed)
Telephone call to patient today regarding her pp visit.  She has been scheduled for 03-16-2022 at 10 am (9:30 arrival time).  Hart Carwin, RN

## 2022-03-16 ENCOUNTER — Ambulatory Visit: Payer: Medicaid Other

## 2022-03-16 ENCOUNTER — Telehealth: Payer: Self-pay

## 2022-03-16 NOTE — Telephone Encounter (Signed)
[facility] for post-partum 03/16/22. Call to client and recorded message states voicemail box is full. Call to sister (emergency contact) and left message requesting assistance contacting client to reschedule her appt. Number to call provided. Jossie Ng, RN

## 2022-03-17 NOTE — Telephone Encounter (Signed)
MyChart message sent to patient today regarding her missed pp visit 03-16-2022.  Last patient login 01-26-2022.  Hart Carwin, RN

## 2022-03-19 NOTE — Telephone Encounter (Signed)
Telephone call to patient today regarding her pp visit.  Voicemail is full. Hart Carwin, RN

## 2022-03-25 NOTE — Telephone Encounter (Signed)
Text message sent to patient today via work cell [phone].  Text is asking patient to call and reschedule her pp appointment at [phone].  If she is not interested in rescheduling her pp appointment to please respond to the text and a note will be put in her chart and phone calls will be stopped.  Hart Carwin, RN

## 2022-03-26 NOTE — Telephone Encounter (Signed)
Telephone call to patient this morning regarding the need to reschedule her pp visit.  Phone states "not able to receive calls right now".  Hart Carwin, RN

## 2022-03-27 NOTE — Telephone Encounter (Signed)
Telephone call to patient to reschedule her pp appointment.  Voicemail is full.  Hart Carwin, RN

## 2022-03-30 NOTE — Telephone Encounter (Signed)
Call to client and left message requesting she schedule post-partum appt. Number to call provided. Call to emergency contact and requested she assist [place] in contacting client to schedule appt. Female states she will tell [employer] to call us. Jossie Ng, RN

## 2022-04-01 NOTE — Telephone Encounter (Signed)
Telephone call to patient today to reschedule her pp appointment.  Mailbox is full.  No response to prior messages, text and MyChart messages.  Hart Carwin, RN

## 2022-04-02 NOTE — Telephone Encounter (Signed)
Telephone call to patient today to reschedule her pp appointment.  Voicemail is full. Hart Carwin, RN   Telephone call to pateint contact Estanislado Emms Debord/patient sister).  [phone]  Left message to have patient call [phone] to reschedule her appointment or if she is not interested in proceeding with her pp appointment to please call me at [phone] and I will make a note in her chart and we will not keep calling for attempts to reschedule.  Hart Carwin, RN

## 2022-04-03 NOTE — Telephone Encounter (Signed)
Telephone call to patient. Voicemail full.  Hart Carwin, RN

## 2022-04-06 NOTE — Telephone Encounter (Signed)
Telephone call to patient today and voicemail is full. Hart Carwin, RN

## 2022-04-08 NOTE — Telephone Encounter (Signed)
Correction to phone note 04/08/2022 => message left requesting she schedule a post-partum appt. Jossie Ng, RN

## 2022-04-08 NOTE — Telephone Encounter (Signed)
Call to client to reschedule missed [facility] appt. Per recorded message, voicemail box is full. Call to emergency contact (sister) and left message requesting assistance contacting client to call [facility] for prenatal care appt. Number to call provided. Jossie Ng, RN

## 2022-05-07 ENCOUNTER — Ambulatory Visit: Payer: Medicaid Other

## 2022-10-14 IMAGING — US US OB COMP +14 WK
1 of 2 series · 13 of 28 positions shown · non-contrast
Comparison: none

CLINICAL DATA: Pregnancy.  Assess fetal anatomy

EXAM:
OBSTETRICAL ULTRASOUND >14 WKS

[Series 1: us ob comp + 14 wk · 13 of 85 slices shown]
[im 4/85]
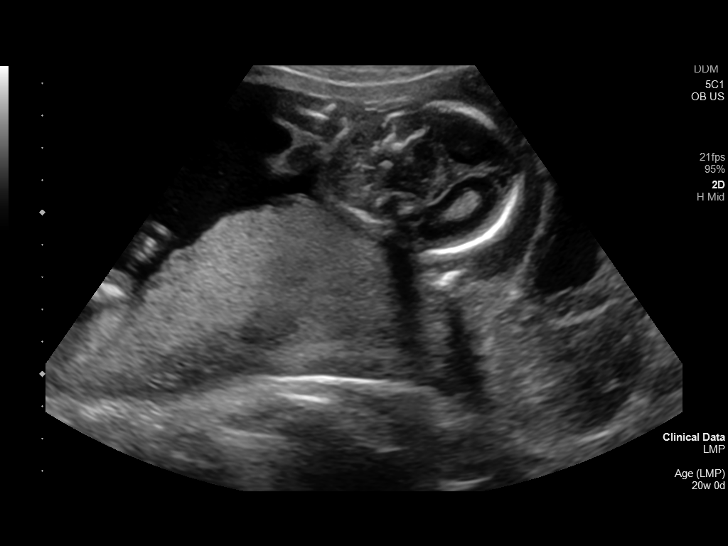
[im 10/85]
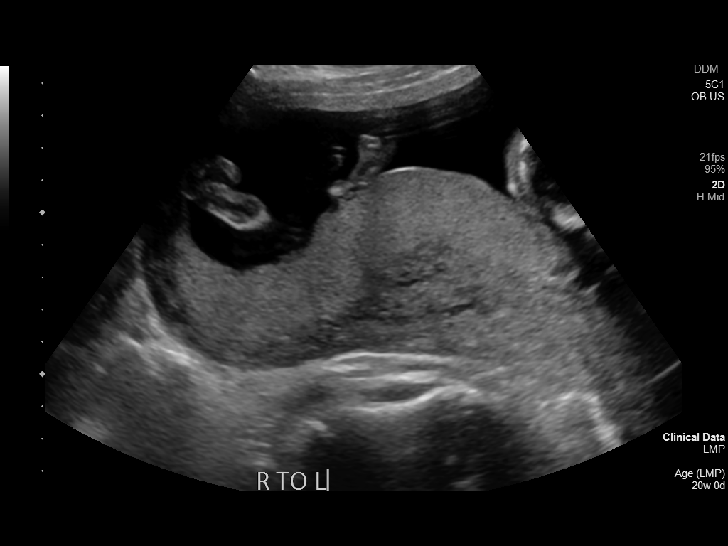
[im 17/85]
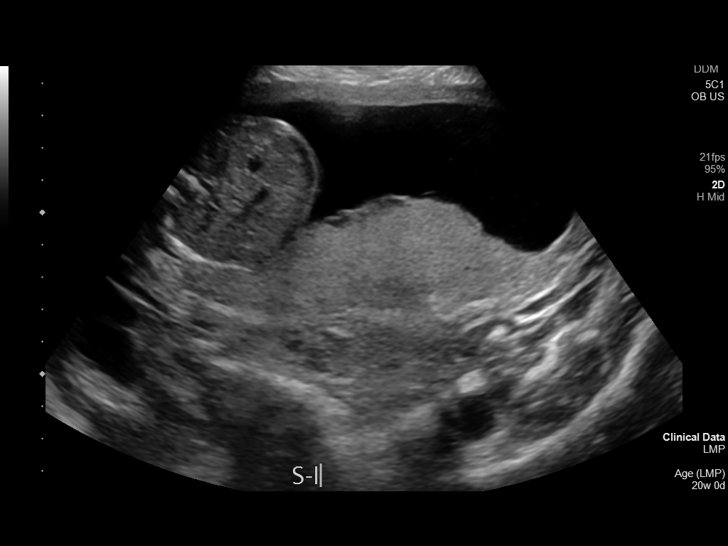
[im 23/85]
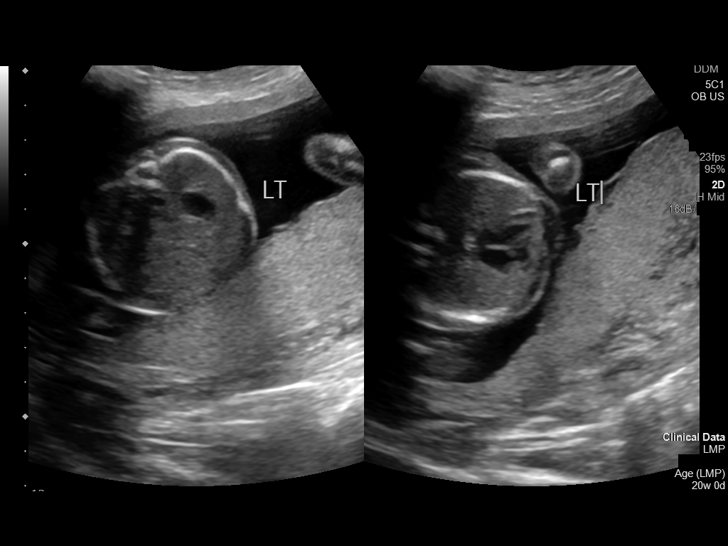
[im 30/85]
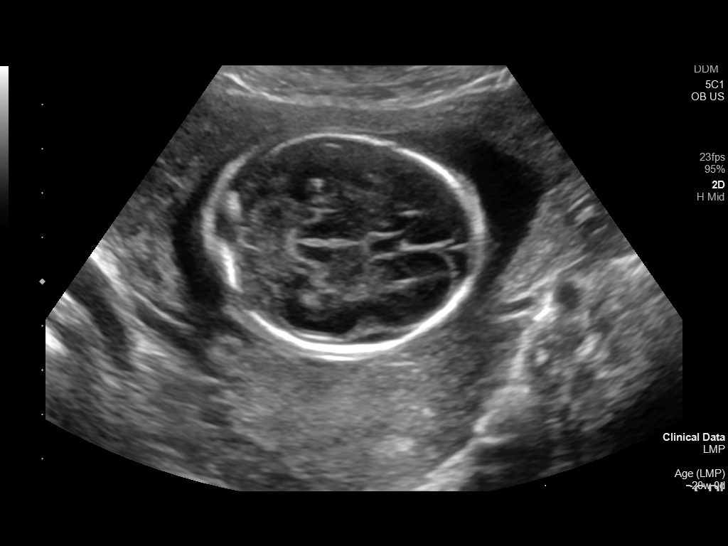
[im 36/85]
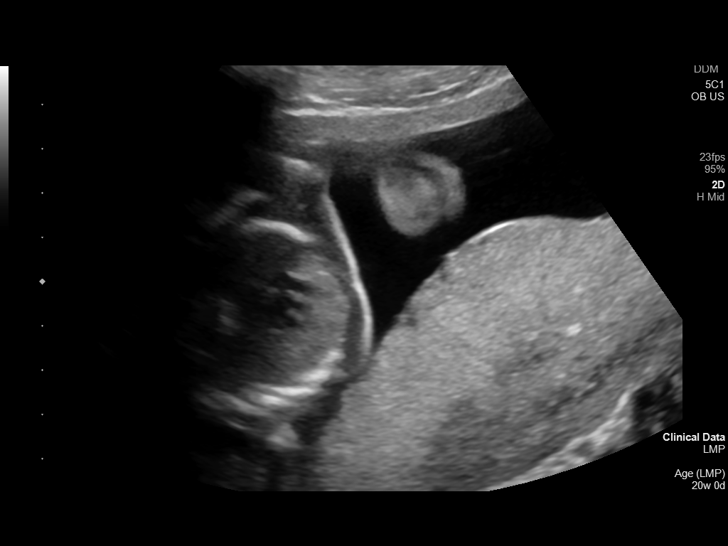
[im 46/85]
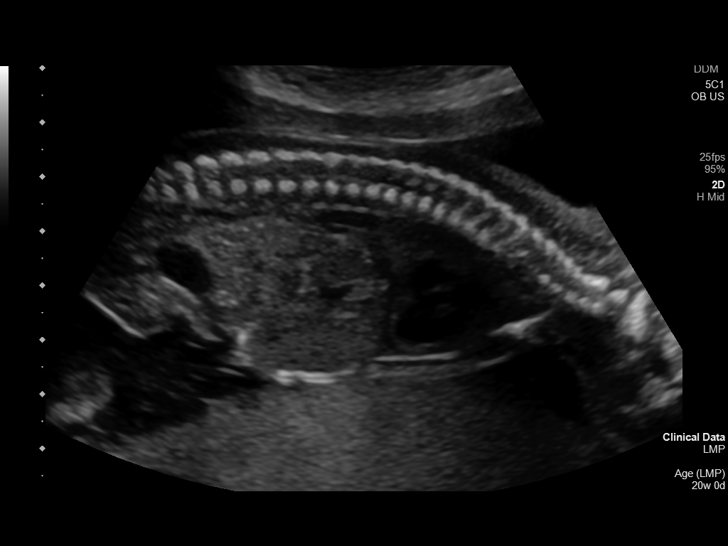
[im 52/85]
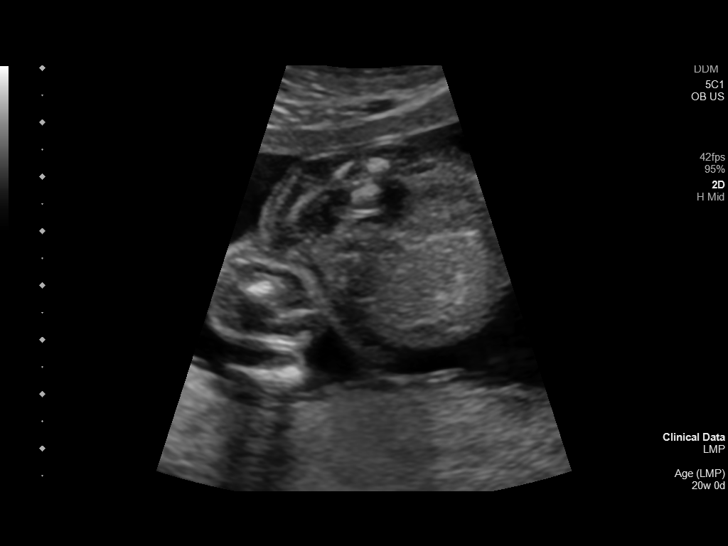
[im 59/85]
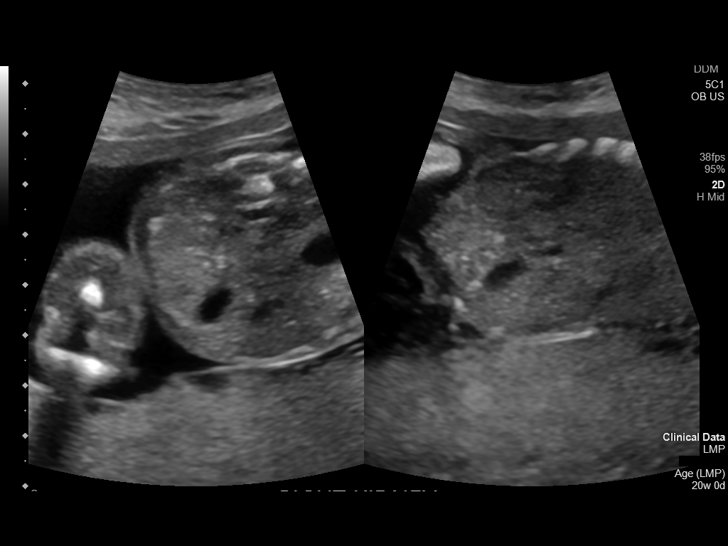
[im 65/85]
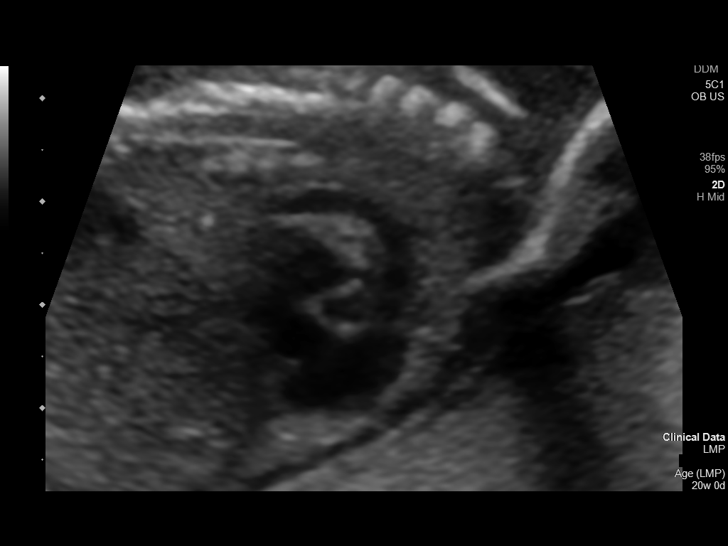
[im 72/85]
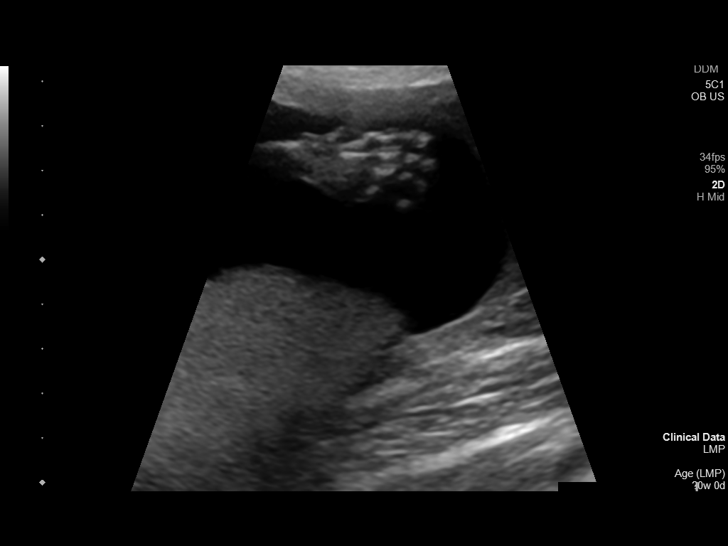
[im 78/85]
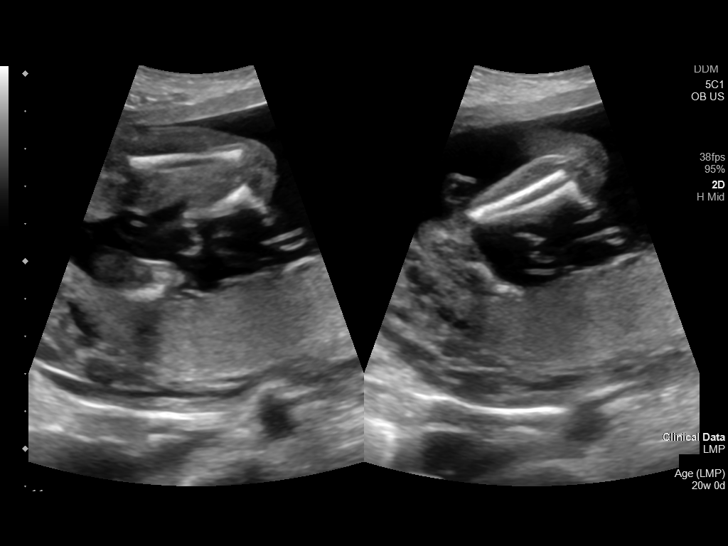
[im 85/85]
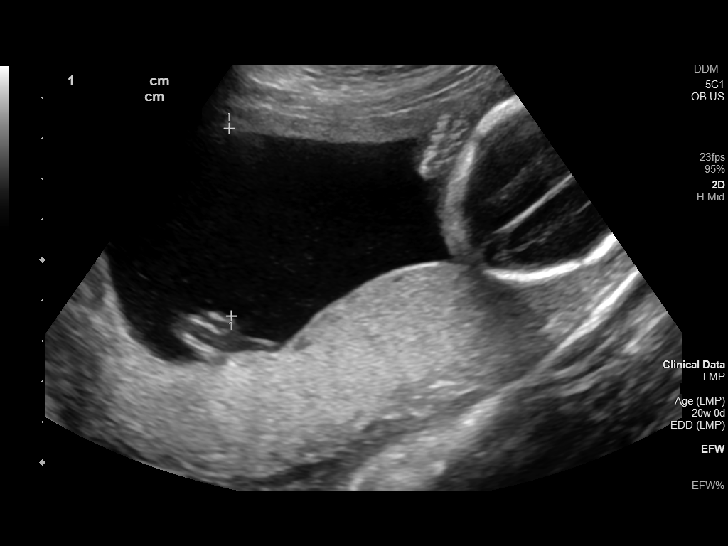

[13 of 28 positions shown; findings below may reference images not displayed]

FINDINGS: Number of Fetuses: 1

Heart Rate:  138 bpm

Movement: Yes

Presentation: Cephalic

Previa: Low lying. Placental tip measures 1.9 cm to the internal
cervical os.

Placental Location: Posterior

Amniotic Fluid (Subjective): Normal

Amniotic Fluid (Objective):

Vertical pocket = 4.6cm

FETAL BIOMETRY

BPD: 4.9cm 20w 6d

HC:   17.9cm 20w 2d

AC:   15.0cm 20w 2d

FL:   3.2cm 19w 6d

Current Mean GA: 20w 2d US EDC: 01/12/2022

Assigned GA:  20w 0d Assigned EDC: 01/14/2022

Estimated Fetal Weight:  335g

FETAL ANATOMY

Lateral Ventricles: Appears normal

Thalami/CSP: Appears normal

Posterior Fossa:  Appears normal

Nuchal Region: Appears normal

Upper Lip: Appears normal

Spine: Appears normal

4 Chamber Heart on Left: Appears normal

LVOT: Appears normal

RVOT: Appears normal

Stomach on Left: Appears normal

3 Vessel Cord: Appears normal

Cord Insertion site: Appears normal

Kidneys: Appears normal

Bladder: Appears normal

Extremities: Appears normal

Sex: Male

Technically difficult due to: None.

Maternal Findings:

Cervix:  Closed.  5.3 cm.
IMPRESSION: 1. Single live intrauterine gestation in cephalic presentation.
2. Mean gestational age of 20 weeks 2 days.
3. Low lying posterior placenta. Placental tip measures 1.9 cm to
the internal cervical os.
4. Complete fetal anatomic survey.  No fetal anomalies detected.
